# Patient Record
Sex: Female | Born: 1965 | Race: Black or African American | Hispanic: No | Marital: Single | Smoking: Never smoker
Health system: Southern US, Community
[De-identification: ages and names within clinical notes are randomized; demographics above are authoritative.]

## PROBLEM LIST (undated history)

## (undated) DIAGNOSIS — D649 Anemia, unspecified: Secondary | ICD-10-CM

## (undated) DIAGNOSIS — M1990 Unspecified osteoarthritis, unspecified site: Secondary | ICD-10-CM

## (undated) HISTORY — PX: VARICOSE VEIN SURGERY: SHX832

---

## 2004-01-18 ENCOUNTER — Ambulatory Visit: Payer: Self-pay | Admitting: Specialist

## 2005-07-25 ENCOUNTER — Ambulatory Visit: Payer: Self-pay

## 2010-09-22 DIAGNOSIS — L03116 Cellulitis of left lower limb: Secondary | ICD-10-CM | POA: Insufficient documentation

## 2012-01-08 ENCOUNTER — Ambulatory Visit: Payer: Self-pay | Admitting: Internal Medicine

## 2015-07-05 ENCOUNTER — Encounter: Payer: Self-pay | Admitting: Family Medicine

## 2015-07-05 ENCOUNTER — Ambulatory Visit (INDEPENDENT_AMBULATORY_CARE_PROVIDER_SITE_OTHER): Payer: Self-pay | Admitting: Family Medicine

## 2015-07-05 VITALS — BP 100/70 | HR 64 | Ht 61.0 in | Wt 243.0 lb

## 2015-07-05 DIAGNOSIS — L02415 Cutaneous abscess of right lower limb: Secondary | ICD-10-CM

## 2015-07-05 DIAGNOSIS — L739 Follicular disorder, unspecified: Secondary | ICD-10-CM

## 2015-07-05 DIAGNOSIS — Z23 Encounter for immunization: Secondary | ICD-10-CM

## 2015-07-05 MED ORDER — MUPIROCIN 2 % EX OINT: 1.0000 "application " | TOPICAL_OINTMENT | Freq: Two times a day (BID) | CUTANEOUS | Status: DC

## 2015-07-05 MED ORDER — DOXYCYCLINE HYCLATE 100 MG PO TABS: 100.0000 mg | ORAL_TABLET | Freq: Two times a day (BID) | ORAL | Status: DC

## 2015-07-05 MED ORDER — CLOBETASOL PROPIONATE 0.05 % EX OINT: 1.0000 "application " | TOPICAL_OINTMENT | Freq: Two times a day (BID) | CUTANEOUS | Status: DC

## 2015-07-05 NOTE — Progress Notes (Signed)
Name: Grace Harris   MRN: [id number]    DOB: [birth year]   Date:07/05/2015       Progress Note  Subjective  Chief Complaint  Chief Complaint  Patient presents with  . Recurrent Skin Infections    on leg, stomach, and genitals    Rash This is a new problem. The current episode started in the past 7 days. The problem has been gradually worsening since onset. The affected locations include the right lowerleg, genitalia and abdomen. The rash is characterized by itchiness, redness and swelling. She was exposed to nothing. Pertinent negatives include no anorexia, congestion, cough, diarrhea, eye pain, facial edema, fatigue, fever, joint pain, nail changes, rhinorrhea, shortness of breath, sore throat or vomiting. The treatment provided mild relief.    No problem-specific assessment & plan notes found for this encounter.   History reviewed. No pertinent past medical history.  Past Surgical History  Procedure Laterality Date  . Cesarean section      x 2  . Varicose vein surgery      Family History  Problem Relation Age of Onset  . Diabetes Maternal Uncle   . Cancer Maternal Grandmother     Social History   Social History  . Marital Status: Single    Spouse Name: N/A  . Number of Children: N/A  . Years of Education: N/A   Occupational History  . Not on file.   Social History Main Topics  . Smoking status: Never Smoker   . Smokeless tobacco: Not on file  . Alcohol Use: 0.0 oz/week    0 Standard drinks or equivalent per week  . Drug Use: No  . Sexual Activity: Yes   Other Topics Concern  . Not on file   Social History Narrative  . No narrative on file    No Known Allergies   Review of Systems  Constitutional: Negative for fever, chills, weight loss, malaise/fatigue and fatigue.  HENT: Negative for congestion, ear discharge, ear pain, rhinorrhea and sore throat.   Eyes: Negative for blurred vision and pain.  Respiratory: Negative for cough, sputum  production, shortness of breath and wheezing.   Cardiovascular: Negative for chest pain, palpitations and leg swelling.  Gastrointestinal: Negative for heartburn, nausea, vomiting, abdominal pain, diarrhea, constipation, blood in stool, melena and anorexia.  Genitourinary: Negative for dysuria, urgency, frequency and hematuria.  Musculoskeletal: Negative for myalgias, back pain, joint pain and neck pain.  Skin: Positive for rash. Negative for nail changes.  Neurological: Negative for dizziness, tingling, sensory change, focal weakness and headaches.  Endo/Heme/Allergies: Negative for environmental allergies and polydipsia. Does not bruise/bleed easily.  Psychiatric/Behavioral: Negative for depression and suicidal ideas. The patient is not nervous/anxious and does not have insomnia.      Objective  Filed Vitals:   07/05/15 0814  BP: 100/70  Pulse: 64  Height:  (1.549 m)  Weight: 243 lb (110.224 kg)    Physical Exam  Constitutional: She is well-developed, well-nourished, and in no distress. No distress.  HENT:  Head: Normocephalic and atraumatic.  Right Ear: External ear normal.  Left Ear: External ear normal.  Nose: Nose normal.  Mouth/Throat: Oropharynx is clear and moist.  Eyes: Conjunctivae and EOM are normal. Pupils are equal, round, and reactive to light. Right eye exhibits no discharge. Left eye exhibits no discharge.  Neck: Normal range of motion. Neck supple. No JVD present. No thyromegaly present.  Cardiovascular: Normal rate, regular rhythm, normal heart sounds and intact distal pulses.  Exam reveals no  gallop and no friction rub.   No murmur heard. Pulmonary/Chest: Effort normal and breath sounds normal.  Abdominal: Soft. Bowel sounds are normal. She exhibits no mass. There is no tenderness. There is no guarding.  Musculoskeletal: Normal range of motion. She exhibits no edema.  Lymphadenopathy:    She has no cervical adenopathy.  Neurological: She is alert. She  has normal reflexes.  Skin: Skin is warm, dry and intact. Rash noted. Rash is pustular. She is not diaphoretic. There is erythema.     Tenderness/swelling  Psychiatric: Mood and affect normal.  Nursing note and vitals reviewed.     Assessment & Plan  Problem List Items Addressed This Visit    None    Visit Diagnoses    Abscess of right leg    -  Primary    Relevant Medications    doxycycline (VIBRA-TABS) 100 MG tablet    Folliculitis        Relevant Medications    doxycycline (VIBRA-TABS) 100 MG tablet    clobetasol ointment (TEMOVATE) 0.05 %    mupirocin ointment (BACTROBAN) 2 %    Need for Tdap vaccination        Relevant Orders    Tdap vaccine greater than or equal to 7yo IM (Completed)         Dr. Hayden Rasmusseneanna Loanne Emery [facility]  07/05/2015

## 2015-07-05 NOTE — Patient Instructions (Signed)
Abscess °An abscess is an infected area that contains a collection of pus and debris. It can occur in almost any part of the body. An abscess is also known as a furuncle or boil. °CAUSES  °An abscess occurs when tissue gets infected. This can occur from blockage of oil or sweat glands, infection of hair follicles, or a minor injury to the skin. As the body tries to fight the infection, pus collects in the area and creates pressure under the skin. This pressure causes pain. People with weakened immune systems have difficulty fighting infections and get certain abscesses more often.  °SYMPTOMS °Usually an abscess develops on the skin and becomes a painful mass that is red, warm, and tender. If the abscess forms under the skin, you may feel a moveable soft area under the skin. Some abscesses break open (rupture) on their own, but most will continue to get worse without care. The infection can spread deeper into the body and eventually into the bloodstream, causing you to feel ill.  °DIAGNOSIS  °Your caregiver will take your medical history and perform a physical exam. A sample of fluid may also be taken from the abscess to determine what is causing your infection. °TREATMENT  °Your caregiver may prescribe antibiotic medicines to fight the infection. However, taking antibiotics alone usually does not cure an abscess. Your caregiver may need to make a small cut (incision) in the abscess to drain the pus. In some cases, gauze is packed into the abscess to reduce pain and to continue draining the area. °HOME CARE INSTRUCTIONS  °· Only take over-the-counter or prescription medicines for pain, discomfort, or fever as directed by your caregiver. °· If you were prescribed antibiotics, take them as directed. Finish them even if you start to feel better. °· If gauze is used, follow your caregiver's directions for changing the gauze. °· To avoid spreading the infection: °· Keep your draining abscess covered with a  bandage. °· Wash your hands well. °· Do not share personal care items, towels, or whirlpools with others. °· Avoid skin contact with others. °· Keep your skin and clothes clean around the abscess. °· Keep all follow-up appointments as directed by your caregiver. °SEEK MEDICAL CARE IF:  °· You have increased pain, swelling, redness, fluid drainage, or bleeding. °· You have muscle aches, chills, or a general ill feeling. °· You have a fever. °MAKE SURE YOU:  °· Understand these instructions. °· Will watch your condition. °· Will get help right away if you are not doing well or get worse. °  °This information is not intended to replace advice given to you by your health care provider. Make sure you discuss any questions you have with your health care provider. °  °Document Released: 11/28/2004 Document Revised: 08/20/2011 Document Reviewed: 05/03/2011 °Elsevier Interactive Patient Education ©2016 [employer] ° °Folliculitis °Folliculitis is redness, soreness, and swelling (inflammation) of the hair follicles. This condition can occur anywhere on the body. People with weakened immune systems, diabetes, or obesity have a greater risk of getting folliculitis. °CAUSES °· Bacterial infection. This is the most common cause. °· Fungal infection. °· Viral infection. °· Contact with certain chemicals, especially oils and tars. °Long-term folliculitis can result from bacteria that live in the nostrils. The bacteria may trigger multiple outbreaks of folliculitis over time. °SYMPTOMS °Folliculitis most commonly occurs on the scalp, thighs, legs, back, buttocks, and areas where hair is shaved frequently. An early sign of folliculitis is a small, white or yellow, pus-filled, itchy lesion (  pustule). These lesions appear on a red, inflamed follicle. They are usually less than 0.2 inches (5 mm) wide. When there is an infection of the follicle that goes deeper, it becomes a boil or furuncle. A group of closely packed boils creates a  larger lesion (carbuncle). Carbuncles tend to occur in hairy, sweaty areas of the body. °DIAGNOSIS  °Your caregiver can usually tell what is wrong by doing a physical exam. A sample may be taken from one of the lesions and tested in a lab. This can help determine what is causing your folliculitis. °TREATMENT  °Treatment may include: °· Applying warm compresses to the affected areas. °· Taking antibiotic medicines orally or applying them to the skin. °· Draining the lesions if they contain a large amount of pus or fluid. °· Laser hair removal for cases of long-lasting folliculitis. This helps to prevent regrowth of the hair. °HOME CARE INSTRUCTIONS °· Apply warm compresses to the affected areas as directed by your caregiver. °· If antibiotics are prescribed, take them as directed. Finish them even if you start to feel better. °· You may take over-the-counter medicines to relieve itching. °· Do not shave irritated skin. °· Follow up with your caregiver as directed. °SEEK IMMEDIATE MEDICAL CARE IF:  °· You have increasing redness, swelling, or pain in the affected area. °· You have a fever. °MAKE SURE YOU: °· Understand these instructions. °· Will watch your condition. °· Will get help right away if you are not doing well or get worse. °  °This information is not intended to replace advice given to you by your health care provider. Make sure you discuss any questions you have with your health care provider. °  °Document Released: 04/29/2001 Document Revised: 03/11/2014 Document Reviewed: 05/21/2011 °Elsevier Interactive Patient Education ©2016 Elsevier Inc. ° °

## 2016-01-08 ENCOUNTER — Ambulatory Visit (INDEPENDENT_AMBULATORY_CARE_PROVIDER_SITE_OTHER): Payer: Self-pay | Admitting: Family Medicine

## 2016-01-08 ENCOUNTER — Encounter: Payer: Self-pay | Admitting: Family Medicine

## 2016-01-08 VITALS — BP 100/60 | HR 68 | Ht 61.0 in | Wt 251.0 lb

## 2016-01-08 DIAGNOSIS — B3731 Acute candidiasis of vulva and vagina: Secondary | ICD-10-CM

## 2016-01-08 DIAGNOSIS — N760 Acute vaginitis: Secondary | ICD-10-CM

## 2016-01-08 DIAGNOSIS — B373 Candidiasis of vulva and vagina: Secondary | ICD-10-CM

## 2016-01-08 MED ORDER — METRONIDAZOLE 500 MG PO TABS: 500.0000 mg | ORAL_TABLET | Freq: Two times a day (BID) | ORAL | 0 refills | Status: DC

## 2016-01-08 MED ORDER — FLUCONAZOLE 150 MG PO TABS: 150.0000 mg | ORAL_TABLET | Freq: Once | ORAL | 0 refills | Status: AC

## 2016-01-08 NOTE — Patient Instructions (Signed)

## 2016-01-08 NOTE — Progress Notes (Signed)
Name: Grace EllisSharon D Harris   MRN: [id number]    DOB: 26-Apr-1965   Date:01/08/2016       Progress Note  Subjective  Chief Complaint  Chief Complaint  Patient presents with  . Vaginal Discharge    foul odor- thick, yellow discharge. Hx of bacterial vaginosis    Vaginal Discharge  The patient's primary symptoms include genital itching, a genital odor and vaginal discharge. The patient's pertinent negatives include no genital lesions, genital rash, missed menses, pelvic pain or vaginal bleeding. This is a new problem. The current episode started in the past 7 days. The problem occurs intermittently. The problem has been gradually worsening. The pain is mild. Pertinent negatives include no abdominal pain, back pain, chills, constipation, diarrhea, discolored urine, dysuria, fever, frequency, headaches, hematuria, joint pain, nausea, painful intercourse, rash, sore throat or urgency. The vaginal discharge was thick. There has been no bleeding. She has not been passing clots. Nothing aggravates the symptoms. She has tried nothing for the symptoms. The treatment provided no relief. She uses nothing for contraception. Her past medical history is significant for vaginosis. There is no history of PID or an STD.    No problem-specific Assessment & Plan notes found for this encounter.   History reviewed. No pertinent past medical history.  Past Surgical History:  Procedure Laterality Date  . CESAREAN SECTION     x 2  . VARICOSE VEIN SURGERY      Family History  Problem Relation Age of Onset  . Diabetes Maternal Uncle   . Cancer Maternal Grandmother     Social History   Social History  . Marital status: Single    Spouse name: N/A  . Number of children: N/A  . Years of education: N/A   Occupational History  . Not on file.   Social History Main Topics  . Smoking status: Never Smoker  . Smokeless tobacco: Never Used  . Alcohol use 0.0 oz/week  . Drug use: No  . Sexual activity: Yes    Other Topics Concern  . Not on file   Social History Narrative  . No narrative on file    No Known Allergies   Review of Systems  Constitutional: Negative for chills, fever, malaise/fatigue and weight loss.  HENT: Negative for ear discharge, ear pain and sore throat.   Eyes: Negative for blurred vision.  Respiratory: Negative for cough, sputum production, shortness of breath and wheezing.   Cardiovascular: Negative for chest pain, palpitations and leg swelling.  Gastrointestinal: Negative for abdominal pain, blood in stool, constipation, diarrhea, heartburn, melena and nausea.  Genitourinary: Positive for vaginal discharge. Negative for dysuria, frequency, hematuria, missed menses, pelvic pain and urgency.  Musculoskeletal: Negative for back pain, joint pain, myalgias and neck pain.  Skin: Negative for rash.  Neurological: Negative for dizziness, tingling, sensory change, focal weakness and headaches.  Endo/Heme/Allergies: Negative for environmental allergies and polydipsia. Does not bruise/bleed easily.  Psychiatric/Behavioral: Negative for depression and suicidal ideas. The patient is not nervous/anxious and does not have insomnia.      Objective  Vitals:   01/08/16 1446  BP: 100/60  Pulse: 68  Weight: 251 lb (113.9 kg)  Height: 5\' 1"  (1.549 m)    Physical Exam  Constitutional: She is well-developed, well-nourished, and in no distress. No distress.  HENT:  Head: Normocephalic and atraumatic.  Right Ear: External ear normal.  Left Ear: External ear normal.  Nose: Nose normal.  Mouth/Throat: Oropharynx is clear and moist.  Eyes: Conjunctivae and EOM  are normal. Pupils are equal, round, and reactive to light. Right eye exhibits no discharge. Left eye exhibits no discharge.  Neck: Normal range of motion. Neck supple. No JVD present. No thyromegaly present.  Cardiovascular: Normal rate, regular rhythm, normal heart sounds and intact distal pulses.  Exam reveals no  gallop and no friction rub.   No murmur heard. Pulmonary/Chest: Effort normal and breath sounds normal.  Abdominal: Soft. Bowel sounds are normal. She exhibits no mass. There is no tenderness. There is no guarding.  Musculoskeletal: Normal range of motion. She exhibits no edema.  Lymphadenopathy:    She has no cervical adenopathy.  Neurological: She is alert. She has normal reflexes.  Skin: Skin is warm and dry. She is not diaphoretic.  Psychiatric: Mood and affect normal.      Assessment & Plan  Problem List Items Addressed This Visit    None    Visit Diagnoses    Vaginosis    -  Primary   Relevant Medications   metroNIDAZOLE (FLAGYL) 500 MG tablet   fluconazole (DIFLUCAN) 150 MG tablet   Candidiasis of vulva       Relevant Medications   metroNIDAZOLE (FLAGYL) 500 MG tablet   fluconazole (DIFLUCAN) 150 MG tablet        Dr. Hayden Rasmusseneanna Calen [facility]  01/08/16

## 2016-03-13 ENCOUNTER — Encounter: Payer: Self-pay | Admitting: *Deleted

## 2016-03-13 ENCOUNTER — Telehealth: Payer: Self-pay

## 2016-03-13 ENCOUNTER — Ambulatory Visit
Admission: EM | Admit: 2016-03-13 | Discharge: 2016-03-13 | Disposition: A | Discharge status: Home / Self Care | Payer: Self-pay | Attending: Family Medicine | Admitting: Family Medicine

## 2016-03-13 DIAGNOSIS — J329 Chronic sinusitis, unspecified: Secondary | ICD-10-CM

## 2016-03-13 DIAGNOSIS — J339 Nasal polyp, unspecified: Secondary | ICD-10-CM

## 2016-03-13 MED ORDER — AMOXICILLIN-POT CLAVULANATE 875-125 MG PO TABS: 1.0000 | ORAL_TABLET | Freq: Two times a day (BID) | ORAL | 0 refills | Status: DC

## 2016-03-13 MED ORDER — AMOXICILLIN 500 MG PO CAPS: 1000.0000 mg | ORAL_CAPSULE | Freq: Two times a day (BID) | ORAL | 0 refills | Status: DC

## 2016-03-13 NOTE — ED Provider Notes (Signed)
CSN: [id number]     Arrival date & time 03/13/16  1317 History   First MD Initiated Contact with Patient 03/13/16 1431     Chief Complaint  Patient presents with  . Nasal Congestion  . Cough  . Fever   (Consider location/radiation/quality/duration/timing/severity/associated sxs/prior Treatment) The history is provided by the patient.  Cough  Cough characteristics:  Productive Sputum characteristics:  Manson PasseyBrown and green Severity:  Moderate Onset quality:  Gradual Duration:  6 days Timing:  Constant Associated symptoms: fever   Associated symptoms: no shortness of breath, no sore throat and no wheezing   Fever  Associated symptoms: congestion and cough   Associated symptoms: no sore throat   Sinus pain/pressure with purulent nasal drainage   History reviewed. No pertinent past medical history. Past Surgical History:  Procedure Laterality Date  . CESAREAN SECTION     x 2  . VARICOSE VEIN SURGERY     Family History  Problem Relation Age of Onset  . Diabetes Maternal Uncle   . Cancer Maternal Grandmother    Social History  Substance Use Topics  . Smoking status: Never Smoker  . Smokeless tobacco: Never Used  . Alcohol use No   OB History    No data available     Review of Systems  Constitutional: Positive for fever.  HENT: Positive for congestion, postnasal drip, sinus pain and sinus pressure. Negative for sneezing and sore throat.   Respiratory: Positive for cough. Negative for shortness of breath and wheezing.        Lungs CTA    Allergies  Patient has no known allergies.  Home Medications   Prior to Admission medications   Medication Sig Start Date End Date Taking? Authorizing Provider  amoxicillin-clavulanate (AUGMENTIN) 875-125 MG tablet Take 1 tablet by mouth 2 (two) times daily. 03/13/16   Constancia Geeting, NP  metroNIDAZOLE (FLAGYL) 500 MG tablet Take 1 tablet (500 mg total) by mouth 2 (two) times daily. 01/08/16   Duanne Limerickeanna C Jones, MD   Meds Ordered and  Administered this Visit  Medications - No data to display  BP 130/84 (BP Location: Left Wrist)   Pulse 74   Temp 98.5 F (36.9 C) (Oral)   Resp 16   Ht 5\' 1"  (1.549 m)   Wt 248 lb (112.5 kg)   LMP 02/25/2016   SpO2 100%   BMI 46.86 kg/m  No data found.   Physical Exam  Constitutional: She appears well-developed and well-nourished.  HENT:  Nose: Mucosal edema present. No sinus tenderness. Right sinus exhibits maxillary sinus tenderness and frontal sinus tenderness. Left sinus exhibits maxillary sinus tenderness and frontal sinus tenderness.    Pulmonary/Chest: Effort normal and breath sounds normal. No accessory muscle usage. No respiratory distress. She has no decreased breath sounds. She has no wheezes. She has no rhonchi. She has no rales.    Urgent Care Course   Clinical Course     Procedures (including critical care time)  Labs Review Labs Reviewed - No data to display  Imaging Review No results found.   Visual Acuity Review  Right Eye Distance:   Left Eye Distance:   Bilateral Distance:    Right Eye Near:   Left Eye Near:    Bilateral Near:         MDM   Flonase and saline nasal rinse OTC as directed     Hilton Saephan, NP 03/13/16 1441

## 2016-03-13 NOTE — Telephone Encounter (Signed)
Patient called in today and states that she is unable to afford her Augmentin. [facility] Patient advised that [facility] Amoxicillin [facility]

## 2016-03-13 NOTE — Discharge Instructions (Signed)
Flonase and Saline Nasal rinse OTC as directed

## 2016-03-13 NOTE — ED Triage Notes (Signed)
Patient stated having symptoms of nasal congestion / drainage, fever, and cough 3 days ago. No other symptoms reported.

## 2016-04-18 ENCOUNTER — Ambulatory Visit (INDEPENDENT_AMBULATORY_CARE_PROVIDER_SITE_OTHER): Payer: Self-pay | Admitting: Family Medicine

## 2016-04-18 ENCOUNTER — Encounter: Payer: Self-pay | Admitting: Family Medicine

## 2016-04-18 VITALS — BP 99/72 | HR 92 | Temp 98.1°F | Ht 62.0 in | Wt 241.0 lb

## 2016-04-18 DIAGNOSIS — R6883 Chills (without fever): Secondary | ICD-10-CM

## 2016-04-18 DIAGNOSIS — B379 Candidiasis, unspecified: Secondary | ICD-10-CM

## 2016-04-18 DIAGNOSIS — J0101 Acute recurrent maxillary sinusitis: Secondary | ICD-10-CM

## 2016-04-18 LAB — POCT INFLUENZA A/B
INFLUENZA A, POC: NEGATIVE
Influenza B, POC: NEGATIVE

## 2016-04-18 MED ORDER — FLUCONAZOLE 150 MG PO TABS: 150.0000 mg | ORAL_TABLET | Freq: Once | ORAL | 0 refills | Status: AC

## 2016-04-18 MED ORDER — AMOXICILLIN 500 MG PO CAPS: 500.0000 mg | ORAL_CAPSULE | Freq: Three times a day (TID) | ORAL | 0 refills | Status: DC

## 2016-04-18 NOTE — Progress Notes (Signed)
Name: Grace EllisSharon D Mcglaughlin   MRN: [id number]    DOB: Feb 07, 1966   Date:04/18/2016       Progress Note  Subjective  Chief Complaint  Chief Complaint  Patient presents with  . Sinus Problem    Pt stated having chills, body ache, sinus for 2 days    URI   This is a new problem. The current episode started yesterday. The problem has been gradually worsening. There has been no fever. Associated symptoms include congestion, coughing, headaches, rhinorrhea, sinus pain and sneezing. Pertinent negatives include no abdominal pain, chest pain, diarrhea, dysuria, ear pain, joint pain, joint swelling, nausea, neck pain, plugged ear sensation, rash, sore throat, swollen glands, vomiting or wheezing. She has tried NSAIDs and decongestant for the symptoms. The treatment provided no relief.  Sinusitis  This is a new problem. The current episode started yesterday. The problem has been waxing and waning since onset. There has been no fever. Associated symptoms include chills, congestion, coughing, headaches, sinus pressure and sneezing. Pertinent negatives include no ear pain, neck pain, shortness of breath, sore throat or swollen glands. (Yellow/nasal/sinus drainage)    No problem-specific Assessment & Plan notes found for this encounter.   No past medical history on file.  Past Surgical History:  Procedure Laterality Date  . CESAREAN SECTION     x 2  . VARICOSE VEIN SURGERY      Family History  Problem Relation Age of Onset  . Diabetes Maternal Uncle   . Cancer Maternal Grandmother     Social History   Social History  . Marital status: Single    Spouse name: N/A  . Number of children: N/A  . Years of education: N/A   Occupational History  . Not on file.   Social History Main Topics  . Smoking status: Never Smoker  . Smokeless tobacco: Never Used  . Alcohol use No  . Drug use: No  . Sexual activity: Yes   Other Topics Concern  . Not on file   Social History Narrative  . No  narrative on file    No Known Allergies   Review of Systems  Constitutional: Positive for chills. Negative for fever, malaise/fatigue and weight loss.  HENT: Positive for congestion, rhinorrhea, sinus pain, sinus pressure and sneezing. Negative for ear discharge, ear pain and sore throat.   Eyes: Negative for blurred vision.  Respiratory: Positive for cough. Negative for hemoptysis, sputum production, shortness of breath and wheezing.   Cardiovascular: Negative for chest pain, palpitations and leg swelling.  Gastrointestinal: Negative for abdominal pain, blood in stool, constipation, diarrhea, heartburn, melena, nausea and vomiting.  Genitourinary: Negative for dysuria, frequency, hematuria and urgency.  Musculoskeletal: Negative for back pain, joint pain, myalgias and neck pain.  Skin: Negative for rash.  Neurological: Positive for headaches. Negative for dizziness, tingling, sensory change and focal weakness.  Endo/Heme/Allergies: Negative for environmental allergies and polydipsia. Does not bruise/bleed easily.  Psychiatric/Behavioral: Negative for depression and suicidal ideas. The patient is not nervous/anxious and does not have insomnia.      Objective  Vitals:   04/18/16 0857  BP: 99/72  Pulse: 92  Temp: 98.1 F (36.7 C)  SpO2: 96%  Weight: 241 lb (109.3 kg)  Height: 5\' 2"  (1.575 m)    Physical Exam  Constitutional: She is well-developed, well-nourished, and in no distress. No distress.  HENT:  Head: Normocephalic and atraumatic.  Right Ear: Tympanic membrane, external ear and ear canal normal.  Left Ear: Tympanic membrane, external ear  and ear canal normal.  Nose: Nose normal. Right sinus exhibits no maxillary sinus tenderness and no frontal sinus tenderness. Left sinus exhibits no maxillary sinus tenderness and no frontal sinus tenderness.  Mouth/Throat: Oropharynx is clear and moist.  Eyes: Conjunctivae and EOM are normal. Pupils are equal, round, and reactive to  light. Right eye exhibits no discharge. Left eye exhibits no discharge.  Neck: Normal range of motion. Neck supple. No JVD present. No thyromegaly present.  Cardiovascular: Normal rate, regular rhythm, normal heart sounds and intact distal pulses.  Exam reveals no gallop and no friction rub.   No murmur heard. Pulmonary/Chest: Effort normal and breath sounds normal. She has no wheezes. She has no rales.  Abdominal: Soft. Bowel sounds are normal. She exhibits no mass. There is no tenderness. There is no guarding.  Musculoskeletal: Normal range of motion. She exhibits no edema.  Lymphadenopathy:    She has no cervical adenopathy.  Neurological: She is alert. She has normal reflexes.  Skin: Skin is warm and dry. No rash noted. She is not diaphoretic.  Psychiatric: Mood and affect normal.  Nursing note and vitals reviewed.     Assessment & Plan  Problem List Items Addressed This Visit    None    Visit Diagnoses    Acute recurrent maxillary sinusitis    -  Primary   Relevant Medications   amoxicillin (AMOXIL) 500 MG capsule   fluconazole (DIFLUCAN) 150 MG tablet   Chills (without fever)       Relevant Orders   POCT Influenza A/B (Completed)   Candidiasis       Relevant Medications   fluconazole (DIFLUCAN) 150 MG tablet        Dr. Hayden [facility]  04/18/16

## 2016-06-11 ENCOUNTER — Ambulatory Visit (INDEPENDENT_AMBULATORY_CARE_PROVIDER_SITE_OTHER): Payer: Managed Care, Other (non HMO) | Admitting: Family Medicine

## 2016-06-11 ENCOUNTER — Encounter: Payer: Self-pay | Admitting: Family Medicine

## 2016-06-11 VITALS — BP 128/84 | HR 87 | Resp 16 | Ht 62.0 in | Wt 231.6 lb

## 2016-06-11 DIAGNOSIS — M778 Other enthesopathies, not elsewhere classified: Secondary | ICD-10-CM

## 2016-06-11 DIAGNOSIS — M25542 Pain in joints of left hand: Secondary | ICD-10-CM

## 2016-06-11 DIAGNOSIS — M779 Enthesopathy, unspecified: Principal | ICD-10-CM

## 2016-06-11 MED ORDER — PREDNISONE 10 MG PO TABS: 10.0000 mg | ORAL_TABLET | Freq: Every day | ORAL | 0 refills | Status: DC

## 2016-06-11 MED ORDER — MELOXICAM 7.5 MG PO TABS: 7.5000 mg | ORAL_TABLET | Freq: Every day | ORAL | 0 refills | Status: DC

## 2016-06-11 NOTE — Progress Notes (Signed)
Name: Grace Harris   MRN: [id number]    DOB: 1966/02/17   Date:06/11/2016       Progress Note  Subjective  Chief Complaint  Chief Complaint  Patient presents with  . Hand Pain    left hand Ist digit swelling and pain since Sat.     Hand Pain   The incident occurred 3 to 5 days ago. The incident occurred at home. There was no injury mechanism. The pain is present in the left hand and left fingers. The quality of the pain is described as aching. The pain does not radiate. The pain is at a severity of 4/10. The pain is moderate. The pain has been fluctuating since the incident. Pertinent negatives include no chest pain, muscle weakness, numbness or tingling. The symptoms are aggravated by movement. She has tried NSAIDs for the symptoms. The treatment provided moderate relief.    No problem-specific Assessment & Plan notes found for this encounter.   History reviewed. No pertinent past medical history.  Past Surgical History:  Procedure Laterality Date  . CESAREAN SECTION     x 2  . VARICOSE VEIN SURGERY      Family History  Problem Relation Age of Onset  . Diabetes Maternal Uncle   . Cancer Maternal Grandmother     Social History   Social History  . Marital status: Single    Spouse name: N/A  . Number of children: N/A  . Years of education: N/A   Occupational History  . Not on file.   Social History Main Topics  . Smoking status: Never Smoker  . Smokeless tobacco: Never Used  . Alcohol use No  . Drug use: No  . Sexual activity: Yes   Other Topics Concern  . Not on file   Social History Narrative  . No narrative on file    No Known Allergies  Outpatient Medications Prior to Visit  Medication Sig Dispense Refill  . amoxicillin (AMOXIL) 500 MG capsule Take 1 capsule (500 mg total) by mouth 3 (three) times daily. 30 capsule 0   No facility-administered medications prior to visit.     Review of Systems  Constitutional: Negative for chills, fever,  malaise/fatigue and weight loss.  HENT: Negative for ear discharge, ear pain and sore throat.   Eyes: Negative for blurred vision.  Respiratory: Negative for cough, sputum production, shortness of breath and wheezing.   Cardiovascular: Negative for chest pain, palpitations and leg swelling.  Gastrointestinal: Negative for abdominal pain, blood in stool, constipation, diarrhea, heartburn, melena and nausea.  Genitourinary: Negative for dysuria, frequency, hematuria and urgency.  Musculoskeletal: Negative for back pain, joint pain, myalgias and neck pain.  Skin: Negative for rash.  Neurological: Negative for dizziness, tingling, sensory change, focal weakness, numbness and headaches.  Endo/Heme/Allergies: Negative for environmental allergies and polydipsia. Does not bruise/bleed easily.  Psychiatric/Behavioral: Negative for depression and suicidal ideas. The patient is not nervous/anxious and does not have insomnia.      Objective  Vitals:   06/11/16 1618  BP: 128/84  Pulse: 87  Resp: 16  SpO2: 98%  Weight: 231 lb 9.6 oz (105.1 kg)  Height:  (1.575 m)    Physical Exam  Constitutional: She is well-developed, well-nourished, and in no distress. No distress.  HENT:  Head: Normocephalic and atraumatic.  Right Ear: External ear normal.  Left Ear: External ear normal.  Nose: Nose normal.  Mouth/Throat: Oropharynx is clear and moist.  Eyes: Conjunctivae and EOM are normal. Pupils  are equal, round, and reactive to light. Right eye exhibits no discharge. Left eye exhibits no discharge.  Neck: Normal range of motion. Neck supple. No JVD present. No thyromegaly present.  Cardiovascular: Normal rate, regular rhythm, normal heart sounds and intact distal pulses.  Exam reveals no gallop and no friction rub.   No murmur heard. Pulmonary/Chest: Effort normal and breath sounds normal.  Abdominal: Soft. Bowel sounds are normal. She exhibits no mass. There is no tenderness. There is no  guarding.  Musculoskeletal: Normal range of motion. She exhibits no edema.       Left hand: She exhibits tenderness and bony tenderness. She exhibits normal range of motion.       Hands: Lymphadenopathy:    She has no cervical adenopathy.  Neurological: She is alert.  Skin: Skin is warm and dry. She is not diaphoretic.  Psychiatric: Mood and affect normal.  Nursing note and vitals reviewed.     Assessment & Plan  Problem List Items Addressed This Visit    None    Visit Diagnoses    Left hand tendonitis    -  Primary   Relevant Medications   predniSONE (DELTASONE) 10 MG tablet   meloxicam (MOBIC) 7.5 MG tablet   Arthralgia of left hand       Relevant Medications   predniSONE (DELTASONE) 10 MG tablet   meloxicam (MOBIC) 7.5 MG tablet      Meds ordered this encounter  Medications  . predniSONE (DELTASONE) 10 MG tablet    Sig: Take 1 tablet (10 mg total) by mouth daily with breakfast.    Dispense:  30 tablet    Refill:  0    2 a day for 5  Days then 1 a day for 5 days  . meloxicam (MOBIC) 7.5 MG tablet    Sig: Take 1 tablet (7.5 mg total) by mouth daily.    Dispense:  30 tablet    Refill:  0      Dr. Hayden [facility]  06/11/16

## 2016-06-18 ENCOUNTER — Ambulatory Visit
Admission: EM | Admit: 2016-06-18 | Discharge: 2016-06-18 | Disposition: A | Discharge status: Home / Self Care | Payer: Managed Care, Other (non HMO) | Attending: Family Medicine | Admitting: Family Medicine

## 2016-06-18 DIAGNOSIS — R42 Dizziness and giddiness: Secondary | ICD-10-CM

## 2016-06-18 DIAGNOSIS — E860 Dehydration: Secondary | ICD-10-CM | POA: Diagnosis not present

## 2016-06-18 NOTE — ED Triage Notes (Signed)
Pt says she was at work and started seeing lines in her vision. She has started having a headache, and she just doesn't feel right.

## 2016-06-18 NOTE — ED Provider Notes (Signed)
CSN: [id number]     Arrival date & time 06/18/16  1450 History   First MD Initiated Contact with Patient 06/18/16 1658     Chief Complaint  Patient presents with  . Dizziness   (Consider location/radiation/quality/duration/timing/severity/associated sxs/prior Treatment) HPI  A 51 year old female who states that work today she was not feeling well and having a headache Also noticed some flashing lights in her eyes as well as  lines in her vision. No nausea vomiting denies any near-syncope or syncopal episodes. No palpitations . Her headache is mild located in her mid forehead. Does not have a throbbing quality. She does not have any photosensitivity. Is no history of migraines. Milinda Hirschfeld. States that she had has not been drinking much water today she is not have much thirst. She  tries to drink 2 L per day. She was nowhere near that today. She had a very salty hamburger last night.        History reviewed. No pertinent past medical history. Past Surgical History:  Procedure Laterality Date  . CESAREAN SECTION     x 2  . VARICOSE VEIN SURGERY     Family History  Problem Relation Age of Onset  . Diabetes Maternal Uncle   . Cancer Maternal Grandmother   . Alcohol abuse Mother    Social History  Substance Use Topics  . Smoking status: Never Smoker  . Smokeless tobacco: Never Used  . Alcohol use No   OB History    No data available     Review of Systems  Constitutional: Positive for activity change and fatigue. Negative for chills and fever.  Neurological: Positive for dizziness and headaches. Negative for weakness.  All other systems reviewed and are negative.   Allergies  Patient has no known allergies.  Home Medications   Prior to Admission medications   Not on File   Meds Ordered and Administered this Visit  Medications - No data to display  BP 140/70 (BP Location: Left Arm)   Pulse 67   Temp 98 F (36.7 C) (Oral)   Resp 18   Ht  (1.575 m)   Wt 230  lb (104.3 kg)   LMP 06/11/2016   SpO2 100%   BMI 42.07 kg/m  Orthostatic VS for the past 24 hrs:  BP- Lying Pulse- Lying BP- Sitting Pulse- Sitting BP- Standing at 0 minutes Pulse- Standing at 0 minutes  06/18/16 1521 120/65 64 119/74 67 109/75 73    Physical Exam  Constitutional: She is oriented to person, place, and time. She appears well-developed and well-nourished. No distress.  HENT:  Head: Normocephalic and atraumatic.  Right Ear: External ear normal.  Left Ear: External ear normal.  Nose: Nose normal.  Mouth/Throat: Oropharynx is clear and moist. No oropharyngeal exudate.  Eyes: Conjunctivae and EOM are normal. Pupils are equal, round, and reactive to light. Right eye exhibits no discharge. Left eye exhibits no discharge.  Neck: Normal range of motion. Neck supple.  Pulmonary/Chest: Effort normal and breath sounds normal. No respiratory distress. She has no wheezes. She has no rales.  Musculoskeletal: Normal range of motion.  Lymphadenopathy:    She has no cervical adenopathy.  Neurological: She is alert and oriented to person, place, and time. She displays normal reflexes. No cranial nerve deficit or sensory deficit. She exhibits normal muscle tone. Coordination normal.  Skin: Skin is warm and dry. She is not diaphoretic.  Psychiatric: She has a normal mood and affect. Her behavior is normal. Judgment and  thought content normal.  Nursing note and vitals reviewed.   Urgent Care Course     Procedures (including critical care time)  Labs Review Labs Reviewed - No data to display  Imaging Review No results found.   Visual Acuity Review  Right Eye Distance:   Left Eye Distance:   Bilateral Distance:    Right Eye Near:   Left Eye Near:    Bilateral Near:         MDM   1. Dehydration   2. Dizziness    There are no discharge medications for this patient. Told patient that she used to drink more fluids that portion of her problem is that of dehydration.  Not prescribe any specific medications for her. If she is not improving she should follow-up with her primary care physician Dr. Yetta Barre. He worsens tonight she should go to emergency department. Recommend she should follow-up with Dr. Yetta Barre because of this episode for evaluation and possible treatment.    Lutricia Feil, PA-C 06/18/16 2036    Lutricia Feil, PA-C 06/18/16 2039

## 2016-07-09 ENCOUNTER — Ambulatory Visit (INDEPENDENT_AMBULATORY_CARE_PROVIDER_SITE_OTHER): Payer: Managed Care, Other (non HMO) | Admitting: Family Medicine

## 2016-07-09 ENCOUNTER — Encounter: Payer: Self-pay | Admitting: Family Medicine

## 2016-07-09 VITALS — BP 124/62 | HR 80 | Ht 62.0 in | Wt 231.0 lb

## 2016-07-09 DIAGNOSIS — Z1239 Encounter for other screening for malignant neoplasm of breast: Secondary | ICD-10-CM

## 2016-07-09 DIAGNOSIS — M25562 Pain in left knee: Secondary | ICD-10-CM

## 2016-07-09 DIAGNOSIS — G8929 Other chronic pain: Secondary | ICD-10-CM | POA: Diagnosis not present

## 2016-07-09 DIAGNOSIS — Z0000 Encounter for general adult medical examination without abnormal findings: Secondary | ICD-10-CM

## 2016-07-09 DIAGNOSIS — E663 Overweight: Secondary | ICD-10-CM

## 2016-07-09 DIAGNOSIS — Z1231 Encounter for screening mammogram for malignant neoplasm of breast: Secondary | ICD-10-CM | POA: Diagnosis not present

## 2016-07-09 NOTE — Progress Notes (Signed)
Name: Grace Harris   MRN: [id number]    DOB: February 10, 1966   Date:07/09/2016       Progress Note  Subjective  Chief Complaint  Chief Complaint  Patient presents with  . Annual Exam    no pap, breast exam and sched mammo    Patient presents for annual physical exam.  Leg Pain   The incident occurred more than 1 week ago. There was no injury mechanism. The pain is present in the left knee. The quality of the pain is described as aching. The pain is at a severity of 6/10. The pain is moderate. The pain has been constant since onset. Associated symptoms include an inability to bear weight. Pertinent negatives include no loss of motion, loss of sensation, muscle weakness, numbness or tingling. The symptoms are aggravated by movement. She has tried acetaminophen and NSAIDs for the symptoms. The treatment provided no relief.    No problem-specific Assessment & Plan notes found for this encounter.   No past medical history on file.  Past Surgical History:  Procedure Laterality Date  . CESAREAN SECTION     x 2  . VARICOSE VEIN SURGERY      Family History  Problem Relation Age of Onset  . Diabetes Maternal Uncle   . Cancer Maternal Grandmother   . Alcohol abuse Mother     Social History   Social History  . Marital status: Single    Spouse name: N/A  . Number of children: N/A  . Years of education: N/A   Occupational History  . Not on file.   Social History Main Topics  . Smoking status: Never Smoker  . Smokeless tobacco: Never Used  . Alcohol use No  . Drug use: No  . Sexual activity: Yes   Other Topics Concern  . Not on file   Social History Narrative  . No narrative on file    No Known Allergies  No outpatient prescriptions prior to visit.   No facility-administered medications prior to visit.     Review of Systems  Constitutional: Negative for chills, fever, malaise/fatigue and weight loss.  HENT: Negative for ear discharge, ear pain and sore throat.    Eyes: Negative for blurred vision.  Respiratory: Negative for cough, sputum production, shortness of breath and wheezing.   Cardiovascular: Negative for chest pain, palpitations and leg swelling.  Gastrointestinal: Negative for abdominal pain, blood in stool, constipation, diarrhea, heartburn, melena and nausea.  Genitourinary: Negative for dysuria, frequency, hematuria and urgency.  Musculoskeletal: Negative for back pain, joint pain, myalgias and neck pain.  Skin: Negative for rash.  Neurological: Negative for dizziness, tingling, sensory change, focal weakness, numbness and headaches.  Endo/Heme/Allergies: Negative for environmental allergies and polydipsia. Does not bruise/bleed easily.  Psychiatric/Behavioral: Negative for depression and suicidal ideas. The patient is not nervous/anxious and does not have insomnia.      Objective  Vitals:   07/09/16 0848  BP: 124/62  Pulse: 80  Weight: 231 lb (104.8 kg)  Height: 5\' 2"  (1.575 m)    Physical Exam  Constitutional: She is well-developed, well-nourished, and in no distress. No distress.  HENT:  Head: Normocephalic and atraumatic.  Right Ear: Tympanic membrane, external ear and ear canal normal.  Left Ear: Tympanic membrane, external ear and ear canal normal.  Nose: Nose normal.  Mouth/Throat: Uvula is midline and oropharynx is clear and moist.  Eyes: Conjunctivae, EOM and lids are normal. Pupils are equal, round, and reactive to light. Right eye exhibits  no discharge. Left eye exhibits no discharge.  Fundoscopic exam:      The right eye shows no arteriolar narrowing and no papilledema.       The left eye shows no arteriolar narrowing and no papilledema.  Neck: Trachea normal, normal range of motion and full passive range of motion without pain. Neck supple. Normal carotid pulses, no hepatojugular reflux and no JVD present. Carotid bruit is not present. No thyroid mass and no thyromegaly present.  Cardiovascular: Normal rate,  regular rhythm, S1 normal, S2 normal, normal heart sounds and intact distal pulses.  PMI is not displaced.  Exam reveals no gallop, no S3, no S4 and no friction rub.   No murmur heard. Pulmonary/Chest: Effort normal and breath sounds normal. No accessory muscle usage. No respiratory distress. Right breast exhibits no inverted nipple, no mass, no nipple discharge, no skin change and no tenderness. Left breast exhibits no inverted nipple, no mass, no nipple discharge, no skin change and no tenderness. Breasts are symmetrical.  Abdominal: Soft. Normal aorta and bowel sounds are normal. She exhibits no mass. There is no hepatosplenomegaly. There is no tenderness. There is no guarding and no CVA tenderness.  Musculoskeletal: Normal range of motion. She exhibits no edema.       Left knee: She exhibits no swelling and no effusion. Tenderness found. Medial joint line and lateral joint line tenderness noted. No MCL, no LCL and no patellar tendon tenderness noted.       Cervical back: Normal.       Thoracic back: Normal.       Lumbar back: Normal.  Lymphadenopathy:       Head (right side): No submandibular adenopathy present.       Head (left side): No submandibular adenopathy present.    She has no cervical adenopathy.  Neurological: She is alert. She has normal sensation, normal strength, normal reflexes and intact cranial nerves.  Skin: Skin is warm and dry. She is not diaphoretic. No pallor.  Psychiatric: Mood and affect normal.  Nursing note and vitals reviewed.     Assessment & Plan  Problem List Items Addressed This Visit    None    Visit Diagnoses    Annual physical exam    -  Primary   Relevant Orders   Renal function panel   Lipid panel   CBC with Differential/Platelet   Chronic pain of left knee       Relevant Orders   Ambulatory referral to Orthopedic Surgery   Overweight       Breast cancer screening       Relevant Orders   MM Digital Screening      No orders of the  defined types were placed in this encounter.     Dr. Hayden Rasmusseneanna Georgann Bramble [facility]  07/09/16

## 2016-07-10 LAB — RENAL FUNCTION PANEL
ALBUMIN: 4 g/dL (ref 3.5–5.5)
BUN / CREAT RATIO: 16 (ref 9–23)
BUN: 12 mg/dL (ref 6–24)
CALCIUM: 8.4 mg/dL — AB (ref 8.7–10.2)
CO2: 25 mmol/L (ref 18–29)
CREATININE: 0.74 mg/dL (ref 0.57–1.00)
Chloride: 104 mmol/L (ref 96–106)
GFR calc non Af Amer: 96 mL/min/{1.73_m2} (ref >59)
GFR, EST AFRICAN AMERICAN: 111 mL/min/{1.73_m2} (ref >59)
Glucose: 87 mg/dL (ref 65–99)
PHOSPHORUS: 3.2 mg/dL (ref 2.5–4.5)
POTASSIUM: 4.4 mmol/L (ref 3.5–5.2)
SODIUM: 144 mmol/L (ref 134–144)

## 2016-07-10 LAB — CBC WITH DIFFERENTIAL/PLATELET
BASOS ABS: 0.1 10*3/uL (ref 0.0–0.2)
Basos: 1 %
EOS (ABSOLUTE): 0.4 10*3/uL (ref 0.0–0.4)
Eos: 8 %
HEMOGLOBIN: 10.3 g/dL — AB (ref 11.1–15.9)
Hematocrit: 34.1 % (ref 34.0–46.6)
IMMATURE GRANS (ABS): 0 10*3/uL (ref 0.0–0.1)
Immature Granulocytes: 0 %
LYMPHS ABS: 0.8 10*3/uL (ref 0.7–3.1)
Lymphs: 15 %
MCH: 24.8 pg — ABNORMAL LOW (ref 26.6–33.0)
MCHC: 30.2 g/dL — AB (ref 31.5–35.7)
MCV: 82 fL (ref 79–97)
MONOCYTES: 8 %
Monocytes Absolute: 0.4 10*3/uL (ref 0.1–0.9)
Neutrophils Absolute: 3.5 10*3/uL (ref 1.4–7.0)
Neutrophils: 68 %
Platelets: 260 10*3/uL (ref 150–379)
RBC: 4.15 x10E6/uL (ref 3.77–5.28)
RDW: 17.2 % — ABNORMAL HIGH (ref 12.3–15.4)
WBC: 5.2 10*3/uL (ref 3.4–10.8)

## 2016-07-10 LAB — LIPID PANEL
CHOL/HDL RATIO: 3.2 ratio (ref 0.0–4.4)
Cholesterol, Total: 139 mg/dL (ref 100–199)
HDL: 43 mg/dL (ref >39)
LDL Calculated: 84 mg/dL (ref 0–99)
Triglycerides: 62 mg/dL (ref 0–149)
VLDL Cholesterol Cal: 12 mg/dL (ref 5–40)

## 2016-07-11 ENCOUNTER — Other Ambulatory Visit: Payer: Self-pay | Admitting: *Deleted

## 2016-07-11 ENCOUNTER — Inpatient Hospital Stay
Admission: RE | Admit: 2016-07-11 | Discharge: 2016-07-11 | Disposition: A | Discharge status: Home / Self Care | Payer: Self-pay | Source: Ambulatory Visit | Attending: *Deleted | Admitting: *Deleted

## 2016-07-11 DIAGNOSIS — Z9289 Personal history of other medical treatment: Secondary | ICD-10-CM

## 2016-07-15 DIAGNOSIS — M1711 Unilateral primary osteoarthritis, right knee: Secondary | ICD-10-CM | POA: Insufficient documentation

## 2016-07-15 DIAGNOSIS — M1712 Unilateral primary osteoarthritis, left knee: Secondary | ICD-10-CM | POA: Insufficient documentation

## 2016-07-16 ENCOUNTER — Ambulatory Visit
Admission: RE | Admit: 2016-07-16 | Discharge: 2016-07-16 | Disposition: A | Discharge status: Home / Self Care | Payer: Managed Care, Other (non HMO) | Source: Ambulatory Visit | Attending: Family Medicine | Admitting: Family Medicine

## 2016-07-16 DIAGNOSIS — Z1231 Encounter for screening mammogram for malignant neoplasm of breast: Secondary | ICD-10-CM | POA: Insufficient documentation

## 2016-07-16 DIAGNOSIS — Z1239 Encounter for other screening for malignant neoplasm of breast: Secondary | ICD-10-CM

## 2016-08-05 ENCOUNTER — Other Ambulatory Visit: Payer: Self-pay

## 2016-08-20 ENCOUNTER — Other Ambulatory Visit: Payer: Self-pay | Admitting: Orthopedic Surgery

## 2016-08-20 DIAGNOSIS — M2392 Unspecified internal derangement of left knee: Secondary | ICD-10-CM

## 2016-08-20 DIAGNOSIS — M1712 Unilateral primary osteoarthritis, left knee: Secondary | ICD-10-CM

## 2016-11-21 ENCOUNTER — Other Ambulatory Visit: Payer: Self-pay | Admitting: Orthopedic Surgery

## 2016-11-21 DIAGNOSIS — M2392 Unspecified internal derangement of left knee: Secondary | ICD-10-CM

## 2016-11-21 DIAGNOSIS — M1712 Unilateral primary osteoarthritis, left knee: Secondary | ICD-10-CM

## 2016-11-27 ENCOUNTER — Ambulatory Visit: Payer: Managed Care, Other (non HMO)

## 2017-01-24 ENCOUNTER — Ambulatory Visit
Admission: RE | Admit: 2017-01-24 | Discharge: 2017-01-24 | Disposition: A | Discharge status: Home / Self Care | Payer: Managed Care, Other (non HMO) | Source: Ambulatory Visit | Attending: Orthopedic Surgery | Admitting: Orthopedic Surgery

## 2017-01-24 DIAGNOSIS — M7122 Synovial cyst of popliteal space [Baker], left knee: Secondary | ICD-10-CM | POA: Diagnosis not present

## 2017-01-24 DIAGNOSIS — M1712 Unilateral primary osteoarthritis, left knee: Secondary | ICD-10-CM | POA: Diagnosis not present

## 2017-01-24 DIAGNOSIS — M2392 Unspecified internal derangement of left knee: Secondary | ICD-10-CM | POA: Diagnosis present

## 2017-03-18 ENCOUNTER — Encounter: Payer: Self-pay | Admitting: *Deleted

## 2017-03-18 ENCOUNTER — Ambulatory Visit
Admission: EM | Admit: 2017-03-18 | Discharge: 2017-03-18 | Disposition: A | Discharge status: Home / Self Care | Payer: Managed Care, Other (non HMO) | Attending: Family Medicine | Admitting: Family Medicine

## 2017-03-18 ENCOUNTER — Other Ambulatory Visit: Payer: Self-pay

## 2017-03-18 DIAGNOSIS — J069 Acute upper respiratory infection, unspecified: Secondary | ICD-10-CM

## 2017-03-18 DIAGNOSIS — R05 Cough: Secondary | ICD-10-CM

## 2017-03-18 DIAGNOSIS — B9789 Other viral agents as the cause of diseases classified elsewhere: Secondary | ICD-10-CM

## 2017-03-18 HISTORY — DX: Unspecified osteoarthritis, unspecified site: M19.90

## 2017-03-18 MED ORDER — BENZONATATE 100 MG PO CAPS: 100.0000 mg | ORAL_CAPSULE | Freq: Three times a day (TID) | ORAL | 0 refills | Status: DC | PRN

## 2017-03-18 NOTE — ED Triage Notes (Signed)
Patient started having symptoms of cough, nasal congestion, and body aches 3 days ago. Patient recently returned from a cruise.

## 2017-03-18 NOTE — ED Provider Notes (Signed)
[facility]    CSN: [id number] Arrival date & time: 03/18/17  0827  History   Chief Complaint Chief Complaint  Patient presents with  . Cough  . Nasal Congestion   HPI  52 year old female presents with cough and congestion.  Patient reports that her symptoms started on Saturday.  This occurred after she returned home from a cruise.  She reports that she has had a dry cough and congestion.  No fever.  She reports associated headache and back pain as well.  No reported sick contacts.  He took some TheraFlu without resolution.  No known exacerbating factors.  No other complaints or concerns at this time.  Past Medical History:  Diagnosis Date  . Arthritis    Past Surgical History:  Procedure Laterality Date  . CESAREAN SECTION     x 2  . VARICOSE VEIN SURGERY      OB History    No data available     Home Medications    Prior to Admission medications   Medication Sig Start Date End Date Taking? Authorizing Provider  meloxicam (MOBIC) 15 MG tablet Take 15 mg by mouth daily.   Yes [provider]  traMADol (ULTRAM) 50 MG tablet Take 50 mg by mouth every 6 (six) hours as needed.   Yes [provider]  benzonatate (TESSALON) 100 MG capsule Take 1 capsule (100 mg total) by mouth 3 (three) times daily as needed. 03/18/17   Tommie Samsook, Zollie Clemence G, DO   Family History Family History  Problem Relation Age of Onset  . Diabetes Maternal Uncle   . Cancer Maternal Grandmother   . Alcohol abuse Mother   . Breast cancer Neg Hx    Social History Social History   Tobacco Use  . Smoking status: Never Smoker  . Smokeless tobacco: Never Used  Substance Use Topics  . Alcohol use: No    Alcohol/week: 0.0 oz  . Drug use: No   Allergies   Patient has no known allergies.  Review of Systems Review of Systems  Constitutional: Negative for fever.  HENT: Positive for congestion.   Respiratory: Positive for cough.   Musculoskeletal: Positive for back pain.    Neurological: Positive for headaches.   Physical Exam Triage Vital Signs ED Triage Vitals  Enc Vitals Group     BP --      Pulse Rate 03/18/17 0919 61     Resp 03/18/17 0919 16     Temp 03/18/17 0919 98.4 F (36.9 C)     Temp Source 03/18/17 0919 Oral     SpO2 03/18/17 0919 100 %     Weight 03/18/17 0920 235 lb (106.6 kg)     Height 03/18/17 0920 5\' 2"  (1.575 m)     Head Circumference --      Peak Flow --      Pain Score 03/18/17 0920 0     Pain Loc --      Pain Edu? --      Excl. in GC? --    Updated Vital Signs Pulse 61   Temp 98.4 F (36.9 C) (Oral)   Resp 16   Ht 5\' 2"  (1.575 m)   Wt 235 lb (106.6 kg)   LMP 03/18/2017   SpO2 100%   BMI 42.98 kg/m   Physical Exam  Constitutional: She is oriented to person, place, and time. She appears well-developed and well-nourished. No distress.  HENT:  Head: Normocephalic and atraumatic.  Mouth/Throat: Oropharynx is clear and  moist.  Normal TMs bilaterally.  Neck: Neck supple.  Cardiovascular: Normal rate and regular rhythm.  No murmur heard. Pulmonary/Chest: Effort normal and breath sounds normal. She has no wheezes. She has no rales.  Lymphadenopathy:    She has no cervical adenopathy.  Neurological: She is alert and oriented to person, place, and time.  Psychiatric: She has a normal mood and affect. Her behavior is normal.  Nursing note and vitals reviewed.  UC Treatments / Results  Labs (all labs ordered are listed, but only abnormal results are displayed) Labs Reviewed - No data to display  EKG  EKG Interpretation None       Radiology No results found.  Procedures Procedures (including critical care time)  Medications Ordered in UC Medications - No data to display   Initial Impression / Assessment and Plan / UC Course  I have reviewed the triage vital signs and the nursing notes.  Pertinent labs & imaging results that were available during my care of the patient were reviewed by me and  considered in my medical decision making (see chart for details).    52 year old female presents with a viral illness.  Supportive care and over-the-counter medication as needed.  Tessalon Perles for cough.  Final Clinical Impressions(s) / UC Diagnoses   Final diagnoses:  Viral URI with cough    ED Discharge Orders        Ordered    benzonatate (TESSALON) 100 MG capsule  3 times daily PRN     03/18/17 0938     Controlled Substance Prescriptions [place] Controlled Substance Registry consulted? Not Applicable   Tommie Sams, [place] 03/18/17 8295

## 2017-03-18 NOTE — Discharge Instructions (Signed)
Tylenol/motrin as needed for pain.  Cough medication as needed.  Rest, fluids.  Take care  Dr. Adriana Simasook

## 2017-03-21 ENCOUNTER — Telehealth: Payer: Self-pay | Admitting: *Deleted

## 2017-03-21 NOTE — Telephone Encounter (Signed)
Called patient, no answer, left message to follow up with PCP if symptoms persist or become worse. 

## 2017-04-04 HISTORY — PX: JOINT REPLACEMENT: SHX530

## 2017-04-09 ENCOUNTER — Encounter: Payer: Self-pay | Admitting: Family Medicine

## 2017-04-09 ENCOUNTER — Ambulatory Visit: Payer: BLUE CROSS/BLUE SHIELD | Admitting: Family Medicine

## 2017-04-09 VITALS — BP 120/80 | HR 80 | Ht 62.0 in | Wt 234.0 lb

## 2017-04-09 DIAGNOSIS — B9689 Other specified bacterial agents as the cause of diseases classified elsewhere: Secondary | ICD-10-CM | POA: Diagnosis not present

## 2017-04-09 DIAGNOSIS — Z23 Encounter for immunization: Secondary | ICD-10-CM | POA: Diagnosis not present

## 2017-04-09 DIAGNOSIS — N760 Acute vaginitis: Secondary | ICD-10-CM

## 2017-04-09 LAB — POCT WET PREP WITH KOH
Clue Cells Wet Prep HPF POC: POSITIVE
KOH PREP POC: NEGATIVE
RBC Wet Prep HPF POC: NEGATIVE
TRICHOMONAS UA: NEGATIVE
Yeast Wet Prep HPF POC: NEGATIVE

## 2017-04-09 MED ORDER — METRONIDAZOLE 500 MG PO TABS: 500.0000 mg | ORAL_TABLET | Freq: Two times a day (BID) | ORAL | 0 refills | Status: DC

## 2017-04-09 NOTE — Progress Notes (Signed)
Name: Grace EllisSharon D Gheen   MRN: [id number]    DOB: [birth year]   Date:04/09/2017       Progress Note  Subjective  Chief Complaint  Chief Complaint  Patient presents with  . vaginal odor    "fishy smell before period started"- no smell now, but is currently on 4th day of period    Vaginal Discharge  The patient's primary symptoms include a genital odor and vaginal discharge. The patient's pertinent negatives include no genital itching, genital lesions, genital rash, missed menses, pelvic pain or vaginal bleeding. This is a chronic problem. The current episode started more than 1 year ago. The problem occurs daily. The problem has been gradually worsening. The pain is mild. Associated symptoms include frequency. Pertinent negatives include no abdominal pain, back pain, chills, constipation, diarrhea, discolored urine, dysuria, fever, headaches, hematuria, joint pain, nausea, rash, sore throat or urgency. The vaginal discharge was white, yellow and thin. There has been no bleeding. Nothing aggravates the symptoms.    No problem-specific Assessment & Plan notes found for this encounter.   Past Medical History:  Diagnosis Date  . Arthritis     Past Surgical History:  Procedure Laterality Date  . CESAREAN SECTION     x 2  . VARICOSE VEIN SURGERY      Family History  Problem Relation Age of Onset  . Diabetes Maternal Uncle   . Cancer Maternal Grandmother   . Alcohol abuse Mother   . Breast cancer Neg Hx     Social History   Socioeconomic History  . Marital status: Single    Spouse name: Not on file  . Number of children: Not on file  . Years of education: Not on file  . Highest education level: Not on file  Social Needs  . Financial resource strain: Not on file  . Food insecurity - worry: Not on file  . Food insecurity - inability: Not on file  . Transportation needs - medical: Not on file  . Transportation needs - non-medical: Not on file  Occupational History  . Not on  file  Tobacco Use  . Smoking status: Never Smoker  . Smokeless tobacco: Never Used  Substance and Sexual Activity  . Alcohol use: No    Alcohol/week: 0.0 oz  . Drug use: No  . Sexual activity: Yes  Other Topics Concern  . Not on file  Social History Narrative  . Not on file    No Known Allergies  Outpatient Medications Prior to Visit  Medication Sig Dispense Refill  . meloxicam (MOBIC) 15 MG tablet Take 15 mg by mouth daily.    . traMADol (ULTRAM) 50 MG tablet Take 50 mg by mouth every 6 (six) hours as needed.    . benzonatate (TESSALON) 100 MG capsule Take 1 capsule (100 mg total) by mouth 3 (three) times daily as needed. 30 capsule 0   No facility-administered medications prior to visit.     Review of Systems  Constitutional: Negative for chills, fever, malaise/fatigue and weight loss.  HENT: Negative for ear discharge, ear pain and sore throat.   Eyes: Negative for blurred vision.  Respiratory: Negative for cough, sputum production, shortness of breath and wheezing.   Cardiovascular: Negative for chest pain, palpitations and leg swelling.  Gastrointestinal: Negative for abdominal pain, blood in stool, constipation, diarrhea, heartburn, melena and nausea.  Genitourinary: Positive for frequency and vaginal discharge. Negative for dysuria, hematuria, missed menses, pelvic pain and urgency.  Musculoskeletal: Negative for back pain, joint  pain, myalgias and neck pain.  Skin: Negative for rash.  Neurological: Negative for dizziness, tingling, sensory change, focal weakness and headaches.  Endo/Heme/Allergies: Negative for environmental allergies and polydipsia. Does not bruise/bleed easily.  Psychiatric/Behavioral: Negative for depression and suicidal ideas. The patient is not nervous/anxious and does not have insomnia.      Objective  Vitals:   04/09/17 1043  BP: 120/80  Pulse: 80  Weight: 234 lb (106.1 kg)  Height: 5\' 2"  (1.575 m)    Physical Exam  Constitutional:  She is well-developed, well-nourished, and in no distress. No distress.  HENT:  Head: Normocephalic and atraumatic.  Right Ear: External ear normal.  Left Ear: External ear normal.  Nose: Nose normal.  Mouth/Throat: Oropharynx is clear and moist.  Eyes: Conjunctivae and EOM are normal. Pupils are equal, round, and reactive to light. Right eye exhibits no discharge. Left eye exhibits no discharge.  Neck: Normal range of motion. Neck supple. No JVD present. No thyromegaly present.  Cardiovascular: Normal rate, regular rhythm, normal heart sounds and intact distal pulses. Exam reveals no gallop and no friction rub.  No murmur heard. Pulmonary/Chest: Effort normal and breath sounds normal. She has no wheezes. She has no rales.  Abdominal: Soft. Bowel sounds are normal. She exhibits no mass. There is no tenderness. There is no guarding.  Musculoskeletal: Normal range of motion. She exhibits no edema.  Lymphadenopathy:    She has no cervical adenopathy.  Neurological: She is alert. She has normal reflexes.  Skin: Skin is warm and dry. She is not diaphoretic.  Psychiatric: Mood and affect normal.  Nursing note and vitals reviewed.     Assessment & Plan  Problem List Items Addressed This Visit    None    Visit Diagnoses    Bacterial vaginosis    -  Primary   Relevant Medications   metroNIDAZOLE (FLAGYL) 500 MG tablet   Other Relevant Orders   Flu Vaccine QUAD 36+ mos IM (Completed)   POCT Wet Prep with KOH (Completed)      Meds ordered this encounter  Medications  . metroNIDAZOLE (FLAGYL) 500 MG tablet    Sig: Take 1 tablet (500 mg total) by mouth 2 (two) times daily.    Dispense:  14 tablet    Refill:  0  Health risks of being over weight were discussed and patient was counseled on weight loss options and exercise.    Dr. [facility]  04/09/17

## 2017-05-27 ENCOUNTER — Other Ambulatory Visit: Payer: Self-pay

## 2017-05-27 ENCOUNTER — Encounter
Admission: RE | Admit: 2017-05-27 | Discharge: 2017-05-27 | Disposition: A | Discharge status: Home / Self Care | Payer: BLUE CROSS/BLUE SHIELD | Source: Ambulatory Visit | Attending: Unknown Physician Specialty | Admitting: Unknown Physician Specialty

## 2017-05-27 HISTORY — DX: Anemia, unspecified: D64.9

## 2017-05-27 NOTE — Patient Instructions (Signed)
Your procedure is scheduled on: 05-28-17  Report to Same Day Surgery 2nd floor medical mall [facility](Medical Mall Entrance-take elevator on left to 2nd floor.  Check in with surgery information desk.) To find out your arrival time please call [phone] between 1PM - 3PM on 05-27-17  Remember: Instructions that are not followed completely may result in serious medical risk, up to and including death, or upon the discretion of your surgeon and anesthesiologist your surgery may need to be rescheduled.    _x___ 1. Do not eat food after midnight the night before your procedure. NO GUM OR CANDY AFTER MIDNIGHT.  You may drink clear liquids up to 2 hours before you are scheduled to arrive at the hospital for your procedure.  Do not drink clear liquids within 2 hours of your scheduled arrival to the hospital.  Clear liquids include  --Water or Apple juice without pulp  --Clear carbohydrate beverage such as ClearFast or Gatorade  --Black Coffee or Clear Tea (No milk, no creamers, do not add anything to the coffee or Tea    __x__ 2. No Alcohol for 24 hours before or after surgery.   __x__3. No Smoking or e-cigarettes for 24 prior to surgery.  Do not use any chewable tobacco products for at least 6 hour prior to surgery   ____  4. Bring all medications with you on the day of surgery if instructed.    __x__ 5. Notify your doctor if there is any change in your medical condition     (cold, fever, infections).    x___6. On the morning of surgery brush your teeth with toothpaste and water.  You may rinse your mouth with mouth wash if you wish.  Do not swallow any toothpaste or mouthwash.   Do not wear jewelry, make-up, hairpins, clips or nail polish.  Do not wear lotions, powders, or perfumes. You may wear deodorant.  Do not shave 48 hours prior to surgery. Men may shave face and neck.  Do not bring valuables to the hospital.    [facility] belongings or valuables.    Contacts, dentures or bridgework may not be worn into surgery.  Leave your suitcase in the car. After surgery it may be brought to your room.  For patients admitted to the hospital, discharge time is determined by your treatment team.  _  Patients discharged the day of surgery will not be allowed to drive home.  You will need someone to drive you home and stay with you the night of your procedure.     ____ Take anti-hypertensive listed below, cardiac, seizure, asthma, anti-reflux and psychiatric medicines. These include:  1. NONE  2.  3.  4.  5.  6.  ____Fleets enema or Magnesium Citrate as directed.   ____ Use CHG Soap or sage wipes as directed on instruction sheet   ____ Use inhalers on the day of surgery and bring to hospital day of surgery  ____ Stop Metformin and Janumet 2 days prior to surgery.    ____ Take 1/2 of usual insulin dose the night before surgery and none on the morning surgery.   ____ Follow recommendations from Cardiologist, Pulmonologist or PCP regarding stopping Aspirin, Coumadin, Plavix ,Eliquis, Effient, or Pradaxa, and Pletal.  X____Stop Anti-inflammatories such as Advil, Aleve, Ibuprofen, Motrin, Naproxen,MELOXICAM, Naprosyn, Goodies powders or aspirin products NOW-OK to take Tylenol OR TRAMADOL   ____ Stop supplements until after surgery   ____ Bring C-Pap to the hospital.

## 2017-05-28 ENCOUNTER — Other Ambulatory Visit: Payer: Self-pay

## 2017-05-28 ENCOUNTER — Ambulatory Visit: Payer: BLUE CROSS/BLUE SHIELD | Admitting: Anesthesiology

## 2017-05-28 ENCOUNTER — Ambulatory Visit
Admission: RE | Admit: 2017-05-28 | Discharge: 2017-05-28 | Disposition: A | Discharge status: Home / Self Care | Payer: BLUE CROSS/BLUE SHIELD | Source: Ambulatory Visit | Attending: Unknown Physician Specialty | Admitting: Unknown Physician Specialty

## 2017-05-28 ENCOUNTER — Encounter
Admission: RE | Disposition: A | Discharge status: Home / Self Care | Payer: Self-pay | Source: Ambulatory Visit | Attending: Unknown Physician Specialty

## 2017-05-28 DIAGNOSIS — Z6841 Body Mass Index (BMI) 40.0 and over, adult: Secondary | ICD-10-CM | POA: Diagnosis not present

## 2017-05-28 DIAGNOSIS — Z96652 Presence of left artificial knee joint: Secondary | ICD-10-CM | POA: Insufficient documentation

## 2017-05-28 DIAGNOSIS — Z791 Long term (current) use of non-steroidal anti-inflammatories (NSAID): Secondary | ICD-10-CM | POA: Diagnosis not present

## 2017-05-28 DIAGNOSIS — Z79891 Long term (current) use of opiate analgesic: Secondary | ICD-10-CM | POA: Insufficient documentation

## 2017-05-28 DIAGNOSIS — M24662 Ankylosis, left knee: Secondary | ICD-10-CM | POA: Insufficient documentation

## 2017-05-28 HISTORY — PX: KNEE CLOSED REDUCTION: SHX995

## 2017-05-28 LAB — POCT PREGNANCY, URINE: Preg Test, Ur: NEGATIVE

## 2017-05-28 SURGERY — MANIPULATION, KNEE, CLOSED
Anesthesia: General | Site: Knee | Laterality: Left

## 2017-05-28 MED ORDER — FENTANYL CITRATE (PF) 100 MCG/2ML IJ SOLN
INTRAMUSCULAR | Status: AC
  Administered 2017-05-28: 25 ug via INTRAVENOUS
  Filled 2017-05-28: qty 2

## 2017-05-28 MED ORDER — FENTANYL CITRATE (PF) 100 MCG/2ML IJ SOLN
INTRAMUSCULAR | Status: DC | PRN
  Administered 2017-05-28 (×2): 50 ug via INTRAVENOUS

## 2017-05-28 MED ORDER — ONDANSETRON HCL 4 MG/2ML IJ SOLN
INTRAMUSCULAR | Status: AC
  Filled 2017-05-28: qty 2

## 2017-05-28 MED ORDER — SUCCINYLCHOLINE CHLORIDE 20 MG/ML IJ SOLN
INTRAMUSCULAR | Status: DC | PRN
  Administered 2017-05-28: 20 mg via INTRAVENOUS

## 2017-05-28 MED ORDER — PROPOFOL 10 MG/ML IV BOLUS
INTRAVENOUS | Status: AC
  Filled 2017-05-28: qty 20

## 2017-05-28 MED ORDER — LACTATED RINGERS IV SOLN
INTRAVENOUS | Status: DC
  Administered 2017-05-28: 08:00:00 via INTRAVENOUS

## 2017-05-28 MED ORDER — FAMOTIDINE 20 MG PO TABS
20.0000 mg | ORAL_TABLET | Freq: Once | ORAL | Status: AC
  Administered 2017-05-28: 20 mg via ORAL

## 2017-05-28 MED ORDER — PROPOFOL 10 MG/ML IV BOLUS
INTRAVENOUS | Status: DC | PRN
  Administered 2017-05-28: 80 mg via INTRAVENOUS

## 2017-05-28 MED ORDER — LIDOCAINE HCL (PF) 2 % IJ SOLN
INTRAMUSCULAR | Status: AC
  Filled 2017-05-28: qty 10

## 2017-05-28 MED ORDER — FENTANYL CITRATE (PF) 100 MCG/2ML IJ SOLN
25.0000 ug | INTRAMUSCULAR | Status: AC | PRN
  Administered 2017-05-28 (×6): 25 ug via INTRAVENOUS

## 2017-05-28 MED ORDER — MIDAZOLAM HCL 2 MG/2ML IJ SOLN
INTRAMUSCULAR | Status: AC
  Filled 2017-05-28: qty 2

## 2017-05-28 MED ORDER — MIDAZOLAM HCL 2 MG/2ML IJ SOLN
INTRAMUSCULAR | Status: DC | PRN
  Administered 2017-05-28: 2 mg via INTRAVENOUS

## 2017-05-28 MED ORDER — FENTANYL CITRATE (PF) 100 MCG/2ML IJ SOLN
INTRAMUSCULAR | Status: AC
  Filled 2017-05-28: qty 2

## 2017-05-28 MED ORDER — FAMOTIDINE 20 MG PO TABS
ORAL_TABLET | ORAL | Status: AC
  Administered 2017-05-28: 20 mg via ORAL
  Filled 2017-05-28: qty 1

## 2017-05-28 MED ORDER — ONDANSETRON HCL 4 MG/2ML IJ SOLN
4.0000 mg | Freq: Once | INTRAMUSCULAR | Status: AC | PRN
  Administered 2017-05-28: 4 mg via INTRAVENOUS

## 2017-05-28 NOTE — Anesthesia Preprocedure Evaluation (Signed)
Anesthesia Evaluation  Patient identified by MRN, date of birth, ID band Patient awake    Reviewed: Allergy & Precautions, NPO status , Patient's Chart, lab work & pertinent test results, reviewed documented beta blocker date and time   Airway Mallampati: III  TM Distance: >3 FB     Dental  (+) Chipped   Pulmonary           Cardiovascular      Neuro/Psych    GI/Hepatic   Endo/Other    Renal/GU      Musculoskeletal  (+) Arthritis ,   Abdominal   Peds  Hematology  (+) anemia ,   Anesthesia Other Findings Obese.  Reproductive/Obstetrics                             Anesthesia Physical Anesthesia Plan  ASA: III  Anesthesia Plan: General   Post-op Pain Management:    Induction: Intravenous  PONV Risk Score and Plan:   Airway Management Planned:   Additional Equipment:   Intra-op Plan:   Post-operative Plan:   Informed Consent: I have reviewed the patients History and Physical, chart, labs and discussed the procedure including the risks, benefits and alternatives for the proposed anesthesia with the patient or authorized representative who has indicated his/her understanding and acceptance.     Plan Discussed with: CRNA  Anesthesia Plan Comments:         Anesthesia Quick Evaluation

## 2017-05-28 NOTE — OR Nursing (Signed)
Pt. C/o nausea , zofran adm.

## 2017-05-28 NOTE — Discharge Instructions (Signed)
AMBULATORY SURGERY  DISCHARGE INSTRUCTIONS   1) The drugs that you were given will stay in your system until tomorrow so for the next 24 hours you should not:  A) Drive an automobile B) Make any legal decisions C) Drink any alcoholic beverage   2) You may resume regular meals tomorrow.  Today it is better to start with liquids and gradually work up to solid foods.  You may eat anything you prefer, but it is better to start with liquids, then soup and crackers, and gradually work up to solid foods.   3) Please notify your doctor immediately if you have any unusual bleeding, trouble breathing, redness and pain at the surgery site, drainage, fever, or pain not relieved by medication. 4)   5) Your post-operative visit with Dr.                                     is: Date:                        Time:    Please call to schedule your post-operative visit.  6) Additional Instructions:      Ice pack  RTC in about 10 days  Elevation  Resume PT tomorrow  WBAT

## 2017-05-28 NOTE — H&P (Signed)
  H and P reviewed. No changes. Uploaded at later date. 

## 2017-05-28 NOTE — Transfer of Care (Signed)
Immediate Anesthesia Transfer of Care Note  Patient: Grace EllisSharon D Harris  Procedure(s) Performed: CLOSED MANIPULATION KNEE (Left Knee)  Patient Location: PACU  Anesthesia Type:General  Level of Consciousness: awake, alert  and oriented  Airway & Oxygen Therapy: Patient Spontanous Breathing and Patient connected to nasal cannula oxygen  Post-op Assessment: Report given to RN and Post -op Vital signs reviewed and stable  Post vital signs: Reviewed and stable  Last Vitals:  Vitals Value Taken Time  BP 131/76 05/28/2017  8:23 AM  Temp 36.1 C 05/28/2017  8:23 AM  Pulse 84 05/28/2017  8:25 AM  Resp 17 05/28/2017  8:25 AM  SpO2 100 % 05/28/2017  8:25 AM  Vitals shown include unvalidated device data.  Last Pain:  Vitals:   05/28/17 0745  TempSrc: Temporal  PainSc:          Complications: No apparent anesthesia complications

## 2017-05-28 NOTE — Anesthesia Postprocedure Evaluation (Signed)
Anesthesia Post Note  Patient: Grace EllisSharon D Colgate  Procedure(s) Performed: CLOSED MANIPULATION KNEE (Left Knee)  Patient location during evaluation: PACU Anesthesia Type: General Level of consciousness: awake and alert Pain management: pain level controlled Vital Signs Assessment: post-procedure vital signs reviewed and stable Respiratory status: spontaneous breathing, nonlabored ventilation, respiratory function stable and patient connected to nasal cannula oxygen Cardiovascular status: blood pressure returned to baseline and stable Postop Assessment: no apparent nausea or vomiting Anesthetic complications: no     Last Vitals:  Vitals:   05/28/17 0905 05/28/17 0941  BP: 133/79 129/83  Pulse: 65 67  Resp: 16 16  Temp: (!) 36.1 C   SpO2: 100% 100%    Last Pain:  Vitals:   05/28/17 0941  TempSrc:   PainSc: 2                  Andrianna Manalang S

## 2017-05-28 NOTE — Anesthesia Post-op Follow-up Note (Signed)
Anesthesia QCDR form completed.        

## 2017-05-28 NOTE — Op Note (Signed)
05/28/2017  8:43 AM  PATIENT:  Grace Harris  52 y.o. female  PRE-OPERATIVE DIAGNOSIS:  STATUS POST left TOTAL KNEE replacement  POST-OPERATIVE DIAGNOSIS: Same  PROCEDURE:  Procedure(s): CLOSED MANIPULATION KNEE (Left) knee under anesthesia  SURGEON:   Isidoro DonningHarold Brisa Auth, Jr., MD  ANESTHESIA: General  HISTORY: The patient had a robotic assisted left total knee replacement about 6 weeks ago.  She had only been able to achieve about 80 to 85 degrees of left knee flexion.  Consequently it was felt that she needed her left knee manipulated under anesthesia.  OP NOTE: The patient was taken to the operating room where satisfactory general anesthesia was achieved.  The left knee was then manually manipulated.  I was able to increase her knee flexion from 80-85 degrees to about 115 degrees.  Subsequent to the manipulation an ice pack was applied to the patient's left knee.  The patient was then awakened and transferred to her stretcher bed.  She was taken to the recovery room in satisfactory condition.

## 2017-07-07 ENCOUNTER — Encounter: Payer: Self-pay | Admitting: Family Medicine

## 2017-07-07 ENCOUNTER — Ambulatory Visit: Payer: BLUE CROSS/BLUE SHIELD | Admitting: Family Medicine

## 2017-07-07 VITALS — BP 120/62 | HR 98 | Ht 62.0 in | Wt 243.0 lb

## 2017-07-07 DIAGNOSIS — J0100 Acute maxillary sinusitis, unspecified: Secondary | ICD-10-CM

## 2017-07-07 MED ORDER — AZITHROMYCIN 250 MG PO TABS: ORAL_TABLET | ORAL | 0 refills | Status: DC

## 2017-07-07 NOTE — Progress Notes (Signed)
Name: Grace Harris   MRN: [id number]    DOB: 01-Oct-1965   Date:07/07/2017       Progress Note  Subjective  Chief Complaint  Chief Complaint  Patient presents with  . Sinusitis    head pressure, green nasal production, little cough- no production    Sinusitis  This is a new problem. The current episode started in the past 7 days (Saturday). The problem has been gradually worsening since onset. There has been no fever. Her pain is at a severity of 5/10. The pain is mild. Associated symptoms include congestion, coughing, headaches, sinus pressure and sneezing. Pertinent negatives include no chills, diaphoresis, ear pain, hoarse voice, neck pain, shortness of breath, sore throat or swollen glands. Past treatments include oral decongestants. The treatment provided mild relief.    No problem-specific Assessment & Plan notes found for this encounter.   Past Medical History:  Diagnosis Date  . Anemia   . Arthritis     Past Surgical History:  Procedure Laterality Date  . CESAREAN SECTION     x 2  . JOINT REPLACEMENT Left 04/2017   @ [facility] [place]  . KNEE CLOSED REDUCTION Left 05/28/2017   Procedure: CLOSED MANIPULATION KNEE;  Surgeon: Erin Sons, MD;  Location: [facility] ORS;  Service: Orthopedics;  Laterality: Left;  Marland Kitchen VARICOSE VEIN SURGERY      Family History  Problem Relation Age of Onset  . Diabetes Maternal Uncle   . Cancer Maternal Grandmother   . Alcohol abuse Mother   . Breast cancer Neg Hx     Social History   Socioeconomic History  . Marital status: Single    Spouse name: Not on file  . Number of children: Not on file  . Years of education: Not on file  . Highest education level: Not on file  Occupational History  . Not on file  Social Needs  . Financial resource strain: Not on file  . Food insecurity:    Worry: Not on file    Inability: Not on file  . Transportation needs:    Medical: Not on file    Non-medical: Not on file   Tobacco Use  . Smoking status: Never Smoker  . Smokeless tobacco: Never Used  Substance and Sexual Activity  . Alcohol use: Yes    Alcohol/week: 0.0 oz    Comment: RARE  . Drug use: No  . Sexual activity: Yes  Lifestyle  . Physical activity:    Days per week: Not on file    Minutes per session: Not on file  . Stress: Not on file  Relationships  . Social connections:    Talks on phone: Not on file    Gets together: Not on file    Attends religious service: Not on file    Active member of club or organization: Not on file    Attends meetings of clubs or organizations: Not on file    Relationship status: Not on file  . Intimate partner violence:    Fear of current or ex partner: Not on file    Emotionally abused: Not on file    Physically abused: Not on file    Forced sexual activity: Not on file  Other Topics Concern  . Not on file  Social History Narrative  . Not on file    No Known Allergies  Outpatient Medications Prior to Visit  Medication Sig Dispense Refill  . meloxicam (MOBIC) 15 MG tablet Take 15 mg by  mouth daily as needed for pain.     . traMADol (ULTRAM) 50 MG tablet Take 50 mg by mouth daily as needed for moderate pain.     . metroNIDAZOLE (FLAGYL) 500 MG tablet Take 1 tablet (500 mg total) by mouth 2 (two) times daily. (Patient not taking: Reported on 05/26/2017) 14 tablet 0   No facility-administered medications prior to visit.     Review of Systems  Constitutional: Negative for chills, diaphoresis, fever, malaise/fatigue and weight loss.  HENT: Positive for congestion, sinus pressure and sneezing. Negative for ear discharge, ear pain, hoarse voice and sore throat.   Eyes: Positive for blurred vision.  Respiratory: Positive for cough. Negative for sputum production, shortness of breath and wheezing.   Cardiovascular: Negative for chest pain, palpitations and leg swelling.  Gastrointestinal: Negative for abdominal pain, blood in stool, constipation,  diarrhea, heartburn, melena and nausea.  Genitourinary: Negative for dysuria, frequency, hematuria and urgency.  Musculoskeletal: Negative for back pain, joint pain, myalgias and neck pain.  Skin: Negative for rash.  Neurological: Positive for headaches. Negative for dizziness, tingling, sensory change and focal weakness.  Endo/Heme/Allergies: Negative for environmental allergies and polydipsia. Does not bruise/bleed easily.  Psychiatric/Behavioral: Negative for depression and suicidal ideas. The patient is not nervous/anxious and does not have insomnia.      Objective  Vitals:   07/07/17 1421  BP: 120/62  Pulse: 98  Weight: 243 lb (110.2 kg)  Height:  (1.575 m)    Physical Exam  Constitutional: She is oriented to person, place, and time. She appears well-developed and well-nourished.  HENT:  Head: Normocephalic.  Right Ear: Tympanic membrane, external ear and ear canal normal.  Left Ear: Tympanic membrane, external ear and ear canal normal.  Nose: Mucosal edema present. Right sinus exhibits maxillary sinus tenderness. Right sinus exhibits no frontal sinus tenderness. Left sinus exhibits maxillary sinus tenderness. Left sinus exhibits no frontal sinus tenderness.  Mouth/Throat: Uvula is midline and oropharynx is clear and moist. No oropharyngeal exudate, posterior oropharyngeal edema or posterior oropharyngeal erythema.  Eyes: Pupils are equal, round, and reactive to light. Conjunctivae and EOM are normal. Lids are everted and swept, no foreign bodies found. Left eye exhibits no hordeolum. No foreign body present in the left eye. Right conjunctiva is not injected. Left conjunctiva is not injected. No scleral icterus.  Neck: Normal range of motion. Neck supple. No JVD present. No tracheal deviation present. No thyromegaly present.  Cardiovascular: Normal rate, regular rhythm, normal heart sounds and intact distal pulses. Exam reveals no gallop and no friction rub.  No murmur  heard. Pulmonary/Chest: Effort normal and breath sounds normal. No respiratory distress. She has no decreased breath sounds. She has no wheezes. She has no rhonchi. She has no rales.  Abdominal: Soft. Bowel sounds are normal. She exhibits no mass. There is no hepatosplenomegaly. There is no tenderness. There is no rebound and no guarding.  Musculoskeletal: Normal range of motion. She exhibits no edema or tenderness.  Lymphadenopathy:       Head (right side): No submandibular adenopathy present.       Head (left side): No submandibular adenopathy present.    She has no cervical adenopathy.  Neurological: She is alert and oriented to person, place, and time. She has normal strength. She displays normal reflexes. No cranial nerve deficit.  Skin: Skin is warm. No rash noted.  Psychiatric: She has a normal mood and affect. Her mood appears not anxious. She does not exhibit a depressed mood.  Nursing note and vitals reviewed.     Assessment & Plan  Problem List Items Addressed This Visit    None    Visit Diagnoses    Acute maxillary sinusitis, recurrence not specified    -  Primary   Relevant Medications   azithromycin (ZITHROMAX) 250 MG tablet      Meds ordered this encounter  Medications  . azithromycin (ZITHROMAX) 250 MG tablet    Sig: 2 tablets today then 1 a day for 4 days    Dispense:  6 tablet    Refill:  0      Dr. [facility]  07/07/17

## 2017-08-29 DIAGNOSIS — I8002 Phlebitis and thrombophlebitis of superficial vessels of left lower extremity: Secondary | ICD-10-CM | POA: Insufficient documentation

## 2017-10-07 ENCOUNTER — Ambulatory Visit (INDEPENDENT_AMBULATORY_CARE_PROVIDER_SITE_OTHER): Payer: BLUE CROSS/BLUE SHIELD | Admitting: Vascular Surgery

## 2017-10-07 ENCOUNTER — Encounter (INDEPENDENT_AMBULATORY_CARE_PROVIDER_SITE_OTHER): Payer: Self-pay | Admitting: Vascular Surgery

## 2017-10-07 VITALS — BP 127/73 | HR 77 | Resp 13 | Ht 62.0 in | Wt 228.0 lb

## 2017-10-07 DIAGNOSIS — Z96652 Presence of left artificial knee joint: Secondary | ICD-10-CM | POA: Diagnosis not present

## 2017-10-07 DIAGNOSIS — R600 Localized edema: Secondary | ICD-10-CM | POA: Diagnosis not present

## 2017-10-07 NOTE — Progress Notes (Signed)
Subjective:    Patient ID: Grace EllisSharon D Bertagnolli, female    DOB: 1966-02-05, 52 y.o.   MRN: [id number] Chief Complaint  Patient presents with  . New Patient (Initial Visit)    Phelbitis   Presents as a new patient referred by Dr. [facility] for left lower extremity edema and discomfort.  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  The patient notes that her swelling has progressively worsened over the last few months.  Her swelling is associated with some discomfort.  The patient notes that her swelling worsens with sitting and standing for long periods of time.  The patient feels that her swelling associated discomfort has progressed to the point that she is unable to function on a daily basis and it has become lifestyle limiting.  The patient has been wearing medical grade 1 compression socks for approximately a month and a half with minimal improvement to her edema.  The patient denies any claudication-like symptoms, rest pain or ulcer formation to the bilateral lower extremity.  The patient has been variant seeing what she describes as inflammation to the distal calf.  The patient denies any fever, nausea vomiting.  Review of Systems  Constitutional: Negative.   HENT: Negative.   Eyes: Negative.   Respiratory: Negative.   Cardiovascular: Positive for leg swelling.       Left lower extremity discomfort  Gastrointestinal: Negative.   Endocrine: Negative.   Genitourinary: Negative.   Musculoskeletal: Negative.   Skin: Negative.   Allergic/Immunologic: Negative.   Neurological: Negative.   Hematological: Negative.   Psychiatric/Behavioral: Negative.       Objective:   Physical Exam  Constitutional: She is oriented to person, place, and time. She appears well-developed and well-nourished. No distress.  HENT:  Head: Normocephalic and atraumatic.  Right Ear: External ear normal.  Left Ear: External ear normal.  Eyes: Pupils are equal, round, and reactive  to light. Conjunctivae and EOM are normal.  Neck: Normal range of motion.  Cardiovascular: Normal rate, regular rhythm, normal heart sounds and intact distal pulses.  Pulses:      Radial pulses are 2+ on the right side, and 2+ on the left side.  Unable to palpate pedal pulses due to body habitus and edema however the bilateral feet are warm  Pulmonary/Chest: Effort normal and breath sounds normal.  Musculoskeletal: Normal range of motion. She exhibits edema (Moderate nonpitting edema noted to the left lower extremity mild edema noted to the right lower extremity).  Neurological: She is alert and oriented to person, place, and time.  Skin: Skin is warm and dry. She is not diaphoretic.  There is no stasis dermatitis, fibrosis, cellulitis or active ulcerations at this time.  Psychiatric: She has a normal mood and affect. Her behavior is normal. Judgment and thought content normal.  Vitals reviewed.  BP 127/73 (BP Location: Right Arm, Patient Position: Sitting)   Pulse 77   Resp 13   Ht 5\' 2"  (1.575 m)   Wt 228 lb (103.4 kg)   BMI 41.70 kg/m   Past Medical History:  Diagnosis Date  . Anemia   . Arthritis    Social History   Socioeconomic History  . Marital status: Single    Spouse name: Not on file  . Number of children: Not on file  . Years of education: Not on file  . Highest education level: Not on file  Occupational History  . Not on file  Social Needs  .  Financial resource strain: Not on file  . Food insecurity:    Worry: Not on file    Inability: Not on file  . Transportation needs:    Medical: Not on file    Non-medical: Not on file  Tobacco Use  . Smoking status: Never Smoker  . Smokeless tobacco: Never Used  Substance and Sexual Activity  . Alcohol use: Yes    Alcohol/week: 0.0 oz    Comment: RARE  . Drug use: No  . Sexual activity: Yes  Lifestyle  . Physical activity:    Days per week: Not on file    Minutes per session: Not on file  . Stress: Not on  file  Relationships  . Social connections:    Talks on phone: Not on file    Gets together: Not on file    Attends religious service: Not on file    Active member of club or organization: Not on file    Attends meetings of clubs or organizations: Not on file    Relationship status: Not on file  . Intimate partner violence:    Fear of current or ex partner: Not on file    Emotionally abused: Not on file    Physically abused: Not on file    Forced sexual activity: Not on file  Other Topics Concern  . Not on file  Social History Narrative  . Not on file   Past Surgical History:  Procedure Laterality Date  . CESAREAN SECTION     x 2  . JOINT REPLACEMENT Left 04/2017   @ [facility] [place]  . KNEE CLOSED REDUCTION Left 05/28/2017   Procedure: CLOSED MANIPULATION KNEE;  Surgeon: Erin Sons, MD;  Location: [facility] ORS;  Service: Orthopedics;  Laterality: Left;  Marland Kitchen VARICOSE VEIN SURGERY     Family History  Problem Relation Age of Onset  . Diabetes Maternal Uncle   . Cancer Maternal Grandmother   . Alcohol abuse Mother   . Breast cancer Neg Hx    No Known Allergies     Assessment & Plan:  Presents as a new patient referred by Dr. Gavin Potters for left lower extremity edema and discomfort.  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  The patient notes that her swelling has progressively worsened over the last few months.  Her swelling is associated with some discomfort.  The patient notes that her swelling worsens with sitting and standing for long periods of time.  The patient feels that her swelling associated discomfort has progressed to the point that she is unable to function on a daily basis and it has become lifestyle limiting.  The patient has been wearing medical grade 1 compression socks for approximately a month and a half with minimal improvement to her edema.  The patient denies any claudication-like symptoms, rest pain or ulcer  formation to the bilateral lower extremity.  The patient has been variant seeing what she describes as inflammation to the distal calf.  The patient denies any fever, nausea vomiting.  1. Bilateral lower extremity edema - New She presents with worsening edema to the bilateral lower extremity after a left total knee replacement. Edema to the left lower extremity was greater than compared to the right The she has been wearing medical grade 1 compression socks to the left lower extremity with minimal improvement in her symptoms Recommend 3 layer zinc oxide Unna wraps to the left lower extremity changed weekly for approximately 1  month The patient is to elevate her legs as much as possible heart level or higher Patient is to return in 1 month for follow-up Bring the patient back and have her undergo bilateral lower extremity venous duplex to rule out any contributing venous versus lymphatic disease The patient was instructed to call the office in the interim if any worsening edema or ulcerations to the legs, feet or toes occurs. The patient expresses their understanding.  - VAS [place] LOWER EXTREMITY VENOUS REFLUX; Future  2. History of total left knee replacement (TKR) - Stable Contributing factor to the patient's left lower extremity edema  Current Outpatient Medications on File Prior to Visit  Medication Sig Dispense Refill  . meloxicam (MOBIC) 15 MG tablet Take 15 mg by mouth daily as needed for pain.     . naproxen (NAPROSYN) 500 MG tablet Take by mouth.    Marland Kitchen acetaminophen (TYLENOL) 500 MG tablet Take by mouth.    Marland Kitchen ibuprofen (ADVIL,MOTRIN) 200 MG tablet Take by mouth.    . traMADol (ULTRAM) 50 MG tablet Take 50 mg by mouth daily as needed for moderate pain.      No current facility-administered medications on file prior to visit.    There are no Patient Instructions on file for this visit. No follow-ups on file.  Cheresa Siers A Bao Coreas, PA-C

## 2017-10-09 ENCOUNTER — Telehealth (INDEPENDENT_AMBULATORY_CARE_PROVIDER_SITE_OTHER): Payer: Self-pay

## 2017-10-09 NOTE — Telephone Encounter (Signed)
Patient called stating that unna boots did not support well enough  while working on concrete floor and she was having leg pain also.The patient stated that the compressions stockings were more supportive while she work and stated that she has removed the unna boots.Patient will be keeping her follow up appointment to come back in the office.

## 2017-10-15 ENCOUNTER — Encounter (INDEPENDENT_AMBULATORY_CARE_PROVIDER_SITE_OTHER): Payer: BLUE CROSS/BLUE SHIELD

## 2017-10-22 ENCOUNTER — Encounter (INDEPENDENT_AMBULATORY_CARE_PROVIDER_SITE_OTHER): Payer: BLUE CROSS/BLUE SHIELD

## 2017-10-29 ENCOUNTER — Encounter (INDEPENDENT_AMBULATORY_CARE_PROVIDER_SITE_OTHER): Payer: BLUE CROSS/BLUE SHIELD

## 2017-11-04 ENCOUNTER — Ambulatory Visit (INDEPENDENT_AMBULATORY_CARE_PROVIDER_SITE_OTHER): Payer: BLUE CROSS/BLUE SHIELD | Admitting: Vascular Surgery

## 2017-11-04 ENCOUNTER — Encounter (INDEPENDENT_AMBULATORY_CARE_PROVIDER_SITE_OTHER): Payer: Self-pay | Admitting: Vascular Surgery

## 2017-11-04 VITALS — BP 108/66 | HR 75 | Resp 16 | Ht 62.0 in | Wt 226.4 lb

## 2017-11-04 DIAGNOSIS — Z96652 Presence of left artificial knee joint: Secondary | ICD-10-CM | POA: Diagnosis not present

## 2017-11-04 DIAGNOSIS — R600 Localized edema: Secondary | ICD-10-CM | POA: Diagnosis not present

## 2017-11-04 NOTE — Patient Instructions (Signed)

## 2017-11-04 NOTE — Progress Notes (Signed)
MRN : [id number]  Grace Harris is a 52 y.o. (1965/04/22) female who presents with chief complaint of  Chief Complaint  Patient presents with  . Follow-up  .  History of Present Illness: Patient returns today in follow up of leg swelling.  She only use the Unna boot for a couple of days before taking it off.  She says it did not give her enough support to work and stand on concrete all day which is what she was required for work.  She has been wearing her compression stockings.  Her leg still hurts and is swollen more so on the left than the right.  Current Outpatient Medications  Medication Sig Dispense Refill  . acetaminophen (TYLENOL) 500 MG tablet Take by mouth.    Marland Kitchen ibuprofen (ADVIL,MOTRIN) 200 MG tablet Take by mouth.    . meloxicam (MOBIC) 15 MG tablet Take 15 mg by mouth daily as needed for pain.     . naproxen (NAPROSYN) 500 MG tablet Take by mouth.    . traMADol (ULTRAM) 50 MG tablet Take 50 mg by mouth daily as needed for moderate pain.      No current facility-administered medications for this visit.     Past Medical History:  Diagnosis Date  . Anemia   . Arthritis     Past Surgical History:  Procedure Laterality Date  . CESAREAN SECTION     x 2  . JOINT REPLACEMENT Left 04/2017   @ [facility] [place]  . KNEE CLOSED REDUCTION Left 05/28/2017   Procedure: CLOSED MANIPULATION KNEE;  Surgeon: Erin Sons, MD;  Location: [facility] ORS;  Service: Orthopedics;  Laterality: Left;  Marland Kitchen VARICOSE VEIN SURGERY      Social History Social History   Tobacco Use  . Smoking status: Never Smoker  . Smokeless tobacco: Never Used  Substance Use Topics  . Alcohol use: Yes    Alcohol/week: 0.0 standard drinks    Comment: RARE  . Drug use: No    Family History Family History  Problem Relation Age of Onset  . Diabetes Maternal Uncle   . Cancer Maternal Grandmother   . Alcohol abuse Mother   . Breast cancer Neg Hx     No Known Allergies   REVIEW  OF SYSTEMS (Negative unless checked)  Constitutional: [] Weight loss  [] Fever  [] Chills Cardiac: [] Chest pain   [] Chest pressure   [] Palpitations   [] Shortness of breath when laying flat   [] Shortness of breath at rest   [] Shortness of breath with exertion. Vascular:  [] Pain in legs with walking   [] Pain in legs at rest   [] Pain in legs when laying flat   [] Claudication   [] Pain in feet when walking  [] Pain in feet at rest  [] Pain in feet when laying flat   [] History of DVT   [] Phlebitis   [x] Swelling in legs   [] Varicose veins   [] Non-healing ulcers Pulmonary:   [] Uses home oxygen   [] Productive cough   [] Hemoptysis   [] Wheeze  [] COPD   [] Asthma Neurologic:  [] Dizziness  [] Blackouts   [] Seizures   [] History of stroke   [] History of TIA  [] Aphasia   [] Temporary blindness   [] Dysphagia   [] Weakness or numbness in arms   [] Weakness or numbness in legs Musculoskeletal:  [x] Arthritis   [] Joint swelling   [x] Joint pain   [] Low back pain Hematologic:  [] Easy bruising  [] Easy bleeding   [] Hypercoagulable state   [] Anemic   Gastrointestinal:  [] Blood  in stool   [] Vomiting blood  [] Gastroesophageal reflux/heartburn   [] Abdominal pain Genitourinary:  [] Chronic kidney disease   [] Difficult urination  [] Frequent urination  [] Burning with urination   [] Hematuria Skin:  [] Rashes   [] Ulcers   [] Wounds Psychological:  [] History of anxiety   []  History of major depression.  Physical Examination  BP 108/66 (BP Location: Right Arm)   Pulse 75   Resp 16   Ht 5\' 2"  (1.575 m)   Wt 226 lb 6.4 oz (102.7 kg)   BMI 41.41 kg/m  Gen:  WD/WN, NAD Head: [place]/AT, No temporalis wasting. Ear/Nose/Throat: Hearing grossly intact, nares w/o erythema or drainage Eyes: Conjunctiva clear. Sclera non-icteric Neck: Supple.  Trachea midline Pulmonary:  Good air movement, no use of accessory muscles.  Cardiac: RRR, no JVD Vascular:  Vessel Right Left  Radial Palpable Palpable                          PT 1+ Palpable Not  Palpable  DP 1+ Palpable 1+ Palpable    Musculoskeletal: M/S 5/5 throughout.  No deformity or atrophy.  1+ right lower extremity edema, 2-3+ left lower extremity edema. Neurologic: Sensation grossly intact in extremities.  Symmetrical.  Speech is fluent.  Psychiatric: Judgment intact, Mood & affect appropriate for pt's clinical situation. Dermatologic: No rashes or ulcers noted.  No cellulitis or open wounds.  Significant stasis dermatitis changes are present in the left lower extremity       Labs No results found for this or any previous visit (from the past 2160 hour(s)).  Radiology No results found.  Assessment/Plan  History of total left knee replacement (TKR) Swelling did worsen after the knee replacement.  May be a component of lymphedema with chronic scarring the lymphatic channels.  Bilateral lower extremity edema Wearing her compression stockings and elevating her legs.  We will go ahead and try to get her a venous reflux study in the near future at her convenience.  We will see her back following the study to discuss the results and determine further treatment options.    Festus Barren, MD  11/04/2017 4:44 PM    This note was created with Dragon medical transcription system.  Any errors from dictation are purely unintentional

## 2017-11-04 NOTE — Assessment & Plan Note (Signed)
Swelling did worsen after the knee replacement.  May be a component of lymphedema with chronic scarring the lymphatic channels.

## 2017-11-04 NOTE — Assessment & Plan Note (Signed)
Wearing her compression stockings and elevating her legs.  We will go ahead and try to get her a venous reflux study in the near future at her convenience.  We will see her back following the study to discuss the results and determine further treatment options.

## 2017-11-05 ENCOUNTER — Ambulatory Visit (INDEPENDENT_AMBULATORY_CARE_PROVIDER_SITE_OTHER): Payer: BLUE CROSS/BLUE SHIELD

## 2017-11-05 DIAGNOSIS — R600 Localized edema: Secondary | ICD-10-CM

## 2018-03-13 ENCOUNTER — Ambulatory Visit (INDEPENDENT_AMBULATORY_CARE_PROVIDER_SITE_OTHER): Payer: Self-pay | Admitting: Vascular Surgery

## 2018-03-13 ENCOUNTER — Encounter (INDEPENDENT_AMBULATORY_CARE_PROVIDER_SITE_OTHER): Payer: Self-pay

## 2018-03-13 ENCOUNTER — Encounter

## 2018-03-15 ENCOUNTER — Other Ambulatory Visit: Payer: Self-pay

## 2018-03-15 ENCOUNTER — Ambulatory Visit
Admission: EM | Admit: 2018-03-15 | Discharge: 2018-03-15 | Disposition: A | Discharge status: Home / Self Care | Payer: BC Managed Care – PPO | Attending: Physician Assistant | Admitting: Physician Assistant

## 2018-03-15 ENCOUNTER — Encounter: Payer: Self-pay | Admitting: Gynecology

## 2018-03-15 DIAGNOSIS — J069 Acute upper respiratory infection, unspecified: Secondary | ICD-10-CM | POA: Insufficient documentation

## 2018-03-15 DIAGNOSIS — R519 Headache, unspecified: Secondary | ICD-10-CM

## 2018-03-15 DIAGNOSIS — R51 Headache: Secondary | ICD-10-CM | POA: Diagnosis not present

## 2018-03-15 MED ORDER — PREDNISONE 20 MG PO TABS: 40.0000 mg | ORAL_TABLET | Freq: Every day | ORAL | 0 refills | Status: AC

## 2018-03-15 NOTE — Discharge Instructions (Addendum)
Reviewed use of a nasal saline irrigation system. May consider use of intranasal steroids, such as Flonase as well. Use medications as directed. If antibiotics are prescribed, take the full course of antibiotics. Antibiotics are not indicated at this time as you have only been ill for 3 days, all vitals normal, and exam not suggestive of bacterial infection. You can try prednisone for a few days to help. Also consider use of Sudafed or Mucinex D. Increase rest, fluids. May take Motrin and Tylenol for headache. If you do not improve or if you worsen after a course of antibiotics, you should be re-examined. You may need a different antibiotic or further evaluation with imaging or an exam of the inside of the sinuses

## 2018-03-15 NOTE — ED Provider Notes (Signed)
[facility]    CSN: [id number] Arrival date & time: 03/15/18  0830     History   Chief Complaint Chief Complaint  Patient presents with  . Sinusitis    HPI Grace Harris is a 53 y.o. female. African American female presents for 3 day history of sinus congestion/pressure, frontal headaches, and nasal congestion. Also says that her eyes have been watering, but are not red or hurting. She has been taking Sudafed and using Flonase w/o much relief. Denies fevers and cough. She says she feels like she has a sinus infection. She has no other concerns today.  HPI  Past Medical History:  Diagnosis Date  . Anemia   . Arthritis     Patient Active Problem List   Diagnosis Date Noted  . Bilateral lower extremity edema 10/07/2017  . History of total left knee replacement (TKR) 10/07/2017    Past Surgical History:  Procedure Laterality Date  . CESAREAN SECTION     x 2  . JOINT REPLACEMENT Left 04/2017   @ [facility] IN [place]  . KNEE CLOSED REDUCTION Left 05/28/2017   Procedure: CLOSED MANIPULATION KNEE;  Surgeon: Erin Sons, MD;  Location: [facility] ORS;  Service: Orthopedics;  Laterality: Left;  Marland Kitchen VARICOSE VEIN SURGERY      OB History   No obstetric history on file.      Home Medications    Prior to Admission medications   Medication Sig Start Date End Date Taking? Authorizing Provider  ibuprofen (ADVIL,MOTRIN) 200 MG tablet Take by mouth.   Yes [provider]  meloxicam (MOBIC) 15 MG tablet Take 15 mg by mouth daily as needed for pain.    Yes [provider]  naproxen (NAPROSYN) 500 MG tablet Take by mouth. 08/29/17  Yes [provider]  acetaminophen (TYLENOL) 500 MG tablet Take by mouth.    [provider]  predniSONE (DELTASONE) 20 MG tablet Take 2 tablets (40 mg total) by mouth daily for 5 days. 03/15/18 03/20/18  Shirlee Latch, PA-C  traMADol (ULTRAM) 50 MG tablet Take 50 mg by mouth daily as needed  for moderate pain.     [provider]    Family History Family History  Problem Relation Age of Onset  . Diabetes Maternal Uncle   . Cancer Maternal Grandmother   . Alcohol abuse Mother   . Breast cancer Neg Hx     Social History Social History   Tobacco Use  . Smoking status: Never Smoker  . Smokeless tobacco: Never Used  Substance Use Topics  . Alcohol use: Yes    Alcohol/week: 0.0 standard drinks    Comment: RARE  . Drug use: No     Allergies   Patient has no known allergies.   Review of Systems Review of Systems  Constitutional: Negative for chills, fatigue and fever.  HENT: Positive for congestion, postnasal drip, rhinorrhea, sinus pressure and sore throat (mild). Negative for ear pain, sinus pain and trouble swallowing.   Eyes: Positive for discharge (clear). Negative for pain and redness.  Respiratory: Negative for cough, shortness of breath and wheezing.   Cardiovascular: Negative for chest pain.  Gastrointestinal: Negative for abdominal pain, nausea and vomiting.  Musculoskeletal: Negative for arthralgias and myalgias.  Skin: Negative for color change and rash.  Allergic/Immunologic: Positive for environmental allergies.  Neurological: Positive for headaches. Negative for dizziness and weakness.  Hematological: Negative for adenopathy.     Physical Exam Triage Vital Signs ED Triage Vitals  Enc Vitals Group     BP 03/15/18 0840 127/76     Pulse Rate 03/15/18 0840 66     Resp 03/15/18 0840 18     Temp 03/15/18 0840 98 F (36.7 C)     Temp Source 03/15/18 0840 Oral     SpO2 03/15/18 0840 100 %     Weight 03/15/18 0840 231 lb (104.8 kg)     Height 03/15/18 0840 5\' 2"  (1.575 m)     Head Circumference --      Peak Flow --      Pain Score 03/15/18 0839 7     Pain Loc --      Pain Edu? --      Excl. in GC? --    No data found.  Updated Vital Signs BP 127/76 (BP Location: Left Arm)   Pulse 66   Temp 98 F (36.7 C) (Oral)   Resp 18    Ht 5\' 2"  (1.575 m)   Wt 231 lb (104.8 kg)   LMP 03/04/2018   SpO2 100%   BMI 42.25 kg/m   Visual Acuity Right Eye Distance:   Left Eye Distance:   Bilateral Distance:    Right Eye Near:   Left Eye Near:    Bilateral Near:     Physical Exam Vitals signs and nursing note reviewed.  Constitutional:      General: She is not in acute distress.    Appearance: She is obese.  HENT:     Head: Normocephalic and atraumatic.     Right Ear: Tympanic membrane, ear canal and external ear normal.     Left Ear: Tympanic membrane, ear canal and external ear normal.     Nose: Mucosal edema, congestion and rhinorrhea (moderate purulent drainage w/o swollen nasal mucosa) present.     Right Sinus: No maxillary sinus tenderness or frontal sinus tenderness.     Left Sinus: No maxillary sinus tenderness or frontal sinus tenderness.     Mouth/Throat:     Mouth: Mucous membranes are moist.     Pharynx: Oropharynx is clear.  Eyes:     General: No scleral icterus.       Right eye: No discharge.        Left eye: No discharge.     Conjunctiva/sclera: Conjunctivae normal.  Neck:     Musculoskeletal: Neck supple.  Cardiovascular:     Rate and Rhythm: Normal rate and regular rhythm.     Heart sounds: No murmur.  Pulmonary:     Effort: Pulmonary effort is normal. No respiratory distress.     Breath sounds: Normal breath sounds. No wheezing, rhonchi or rales.  Lymphadenopathy:     Cervical: No cervical adenopathy.  Skin:    General: Skin is warm and dry.     Findings: No rash.  Neurological:     General: No focal deficit present.     Mental Status: She is alert and oriented to person, place, and time.     Motor: No weakness.     Gait: Gait normal.  Psychiatric:        Mood and Affect: Mood normal.        Behavior: Behavior normal.        Thought Content: Thought content normal.      UC Treatments / Results  Labs (all labs ordered are listed, but only abnormal results are displayed) Labs  Reviewed - No data to display  EKG None  Radiology No results found.  Procedures Procedures (including critical care time)  Medications Ordered in UC Medications - No data to display  Initial Impression / Assessment and Plan / UC Course  I have reviewed the triage vital signs and the nursing notes.  Pertinent labs & imaging results that were available during my care of the patient were reviewed by me and considered in my medical decision making (see chart for details).     Final Clinical Impressions(s) / UC Diagnoses   Final diagnoses:  Acute URI  Acute nonintractable headache, unspecified headache type     Discharge Instructions     Reviewed use of a nasal saline irrigation system. May consider use of intranasal steroids, such as Flonase as well. Use medications as directed. If antibiotics are prescribed, take the full course of antibiotics. Antibiotics are not indicated at this time as you have only been ill for 3 days, all vitals normal, and exam not suggestive of bacterial infection. You can try prednisone for a few days to help. Also consider use of Sudafed or Mucinex D. Increase rest, fluids. May take Motrin and Tylenol for headache. If you do not improve or if you worsen after a course of antibiotics, you should be re-examined. You may need a different antibiotic or further evaluation with imaging or an exam of the inside of the sinuses     ED Prescriptions    Medication Sig Dispense Auth. Provider   predniSONE (DELTASONE) 20 MG tablet Take 2 tablets (40 mg total) by mouth daily for 5 days. 10 tablet Shirlee Latch, PA-C     Controlled Substance Prescriptions [place] Controlled Substance Registry consulted? Not Applicable   Gareth Morgan 03/15/18 0321

## 2018-03-15 NOTE — ED Triage Notes (Signed)
Patient c/o nasal drip/ running eyes and headache x 3 days

## 2018-04-14 ENCOUNTER — Encounter (INDEPENDENT_AMBULATORY_CARE_PROVIDER_SITE_OTHER): Payer: Self-pay | Admitting: Vascular Surgery

## 2018-04-14 ENCOUNTER — Ambulatory Visit (INDEPENDENT_AMBULATORY_CARE_PROVIDER_SITE_OTHER): Payer: BC Managed Care – PPO | Admitting: Vascular Surgery

## 2018-04-14 VITALS — BP 123/80 | HR 85 | Resp 14 | Ht 62.0 in | Wt 217.8 lb

## 2018-04-14 DIAGNOSIS — I8312 Varicose veins of left lower extremity with inflammation: Secondary | ICD-10-CM | POA: Insufficient documentation

## 2018-04-14 NOTE — Progress Notes (Signed)
CELENIA PARAMO is a 53 y.o. female who presents with symptomatic venous reflux  Past Medical History:  Diagnosis Date  . Anemia   . Arthritis     Past Surgical History:  Procedure Laterality Date  . CESAREAN SECTION     x 2  . JOINT REPLACEMENT Left 04/2017   @ [facility] [place]  . KNEE CLOSED REDUCTION Left 05/28/2017   Procedure: CLOSED MANIPULATION KNEE;  Surgeon: Erin Sons, MD;  Location: [facility] ORS;  Service: Orthopedics;  Laterality: Left;  Marland Kitchen VARICOSE VEIN SURGERY       Current Outpatient Medications:  .  meloxicam (MOBIC) 15 MG tablet, Take 15 mg by mouth daily as needed for pain. , Disp: , Rfl:  .  naproxen (NAPROSYN) 500 MG tablet, Take by mouth., Disp: , Rfl:  .  traMADol (ULTRAM) 50 MG tablet, Take 50 mg by mouth daily as needed for moderate pain. , Disp: , Rfl:   No Known Allergies   Varicose veins of left lower extremity with inflammation     PLAN: The patient's left lower extremity was sterilely prepped and draped. The ultrasound machine was used to visualize the saphenous vein throughout its course. A segment in the in the upper calf was selected for access. The saphenous vein was accessed without difficulty using ultrasound guidance with a micropuncture needle. A 0.018 wire was then placed.  The wire would not traverse beyond the mid thigh and by ultrasound the vein appeared to be occluded at this location.  It had been almost 6 months since her ultrasound, and she likely had some sort of thrombophlebitis or other situation in this location that caused her to occlude in the interim. The 65 cm sheath was then placed over the wire and the wire and dilator were removed. The laser fiber was then placed through the sheath and its tip was placed the point in the mid thigh where the vein appeared to be occluded. Tumescent anesthesia was then created with a dilute lidocaine solution. Laser energy was then delivered with constant withdrawal of the sheath  and laser fiber. Approximately 585 joules of energy were delivered over a length of 17 centimeters using a 1470 Hz VenaCure machine at 7 W. By ultrasound, when the vein reopened in the proximal thigh it was relatively close to the saphenofemoral junction and I did not feel there was sufficient length to perform laser ablation safely in this location.  Any residual varicosities can be treated with foam sclerotherapy at a later date.  Sterile dressings were placed. The patient tolerated the procedure well without obvious complications.   Follow-up in 1 week with post-laser duplex.

## 2018-04-17 ENCOUNTER — Ambulatory Visit (INDEPENDENT_AMBULATORY_CARE_PROVIDER_SITE_OTHER): Payer: BC Managed Care – PPO

## 2018-04-17 DIAGNOSIS — I8312 Varicose veins of left lower extremity with inflammation: Secondary | ICD-10-CM

## 2018-05-12 ENCOUNTER — Ambulatory Visit (INDEPENDENT_AMBULATORY_CARE_PROVIDER_SITE_OTHER): Payer: BC Managed Care – PPO | Admitting: Nurse Practitioner

## 2018-09-09 ENCOUNTER — Other Ambulatory Visit: Payer: Self-pay

## 2018-09-09 ENCOUNTER — Encounter: Payer: Self-pay | Admitting: Family Medicine

## 2018-09-09 ENCOUNTER — Other Ambulatory Visit (HOSPITAL_COMMUNITY)
Admission: RE | Admit: 2018-09-09 | Discharge: 2018-09-09 | Disposition: A | Discharge status: Home / Self Care | Payer: BC Managed Care – PPO | Source: Ambulatory Visit | Attending: Family Medicine | Admitting: Family Medicine

## 2018-09-09 ENCOUNTER — Ambulatory Visit (INDEPENDENT_AMBULATORY_CARE_PROVIDER_SITE_OTHER): Payer: BC Managed Care – PPO | Admitting: Family Medicine

## 2018-09-09 VITALS — BP 120/64 | HR 80 | Ht 62.0 in | Wt 217.0 lb

## 2018-09-09 DIAGNOSIS — Z0000 Encounter for general adult medical examination without abnormal findings: Secondary | ICD-10-CM | POA: Diagnosis not present

## 2018-09-09 DIAGNOSIS — Z01419 Encounter for gynecological examination (general) (routine) without abnormal findings: Secondary | ICD-10-CM | POA: Insufficient documentation

## 2018-09-09 DIAGNOSIS — Z1211 Encounter for screening for malignant neoplasm of colon: Secondary | ICD-10-CM

## 2018-09-09 DIAGNOSIS — Z124 Encounter for screening for malignant neoplasm of cervix: Secondary | ICD-10-CM | POA: Diagnosis present

## 2018-09-09 DIAGNOSIS — E66812 Obesity, class 2: Secondary | ICD-10-CM

## 2018-09-09 DIAGNOSIS — Z1231 Encounter for screening mammogram for malignant neoplasm of breast: Secondary | ICD-10-CM

## 2018-09-09 DIAGNOSIS — Z6839 Body mass index (BMI) 39.0-39.9, adult: Secondary | ICD-10-CM

## 2018-09-09 DIAGNOSIS — E6609 Other obesity due to excess calories: Secondary | ICD-10-CM

## 2018-09-09 LAB — HEMOCCULT GUIAC POC 1CARD (OFFICE): Fecal Occult Blood, POC: NEGATIVE

## 2018-09-09 NOTE — Patient Instructions (Signed)
Mediterranean Diet A Mediterranean diet refers to food and lifestyle choices that are based on the traditions of countries located on the [employer]. This way of eating has been shown to help prevent certain conditions and improve outcomes for people who have chronic diseases, like kidney disease and heart disease. What are tips for following this plan? Lifestyle  Cook and eat meals together with your family, when possible.  Drink enough fluid to keep your urine clear or pale yellow.  Be physically active every day. This includes: ? Aerobic exercise like running or swimming. ? Leisure activities like gardening, walking, or housework.  Get 7-8 hours of sleep each night.  If recommended by your health care provider, drink red wine in moderation. This means 1 glass a day for nonpregnant women and 2 glasses a day for men. A glass of wine equals 5 oz (150 mL). Reading food labels   Check the serving size of packaged foods. For foods such as rice and pasta, the serving size refers to the amount of cooked product, not dry.  Check the total fat in packaged foods. Avoid foods that have saturated fat or trans fats.  Check the ingredients list for added sugars, such as corn syrup. Shopping  At the grocery store, buy most of your food from the areas near the walls of the store. This includes: ? Fresh fruits and vegetables (produce). ? Grains, beans, nuts, and seeds. Some of these may be available in unpackaged forms or large amounts (in bulk). ? Fresh seafood. ? Poultry and eggs. ? Low-fat dairy products.  Buy whole ingredients instead of prepackaged foods.  Buy fresh fruits and vegetables in-season from local farmers markets.  Buy frozen fruits and vegetables in resealable bags.  If you do not have access to quality fresh seafood, buy precooked frozen shrimp or canned fish, such as tuna, salmon, or sardines.  Buy small amounts of raw or cooked vegetables, salads, or olives from  the deli or salad bar at your store.  Stock your pantry so you always have certain foods on hand, such as olive oil, canned tuna, canned tomatoes, rice, pasta, and beans. Cooking  Cook foods with extra-virgin olive oil instead of using butter or other vegetable oils.  Have meat as a side dish, and have vegetables or grains as your main dish. This means having meat in small portions or adding small amounts of meat to foods like pasta or stew.  Use beans or vegetables instead of meat in common dishes like chili or lasagna.  Experiment with different cooking methods. Try roasting or broiling vegetables instead of steaming or sauteing them.  Add frozen vegetables to soups, stews, pasta, or rice.  Add nuts or seeds for added healthy fat at each meal. You can add these to yogurt, salads, or vegetable dishes.  Marinate fish or vegetables using olive oil, lemon juice, garlic, and fresh herbs. Meal planning   Plan to eat 1 vegetarian meal one day each week. Try to work up to 2 vegetarian meals, if possible.  Eat seafood 2 or more times a week.  Have healthy snacks readily available, such as: ? Vegetable sticks with hummus. ? [place] yogurt. ? Fruit and nut trail mix.  Eat balanced meals throughout the week. This includes: ? Fruit: 2-3 servings a day ? Vegetables: 4-5 servings a day ? Low-fat dairy: 2 servings a day ? Fish, poultry, or lean meat: 1 serving a day ? Beans and legumes: 2 or more servings a week ?  Nuts and seeds: 1-2 servings a day ? Whole grains: 6-8 servings a day ? Extra-virgin olive oil: 3-4 servings a day  Limit red meat and sweets to only a few servings a month What are my food choices?  Mediterranean diet ? Recommended  Grains: Whole-grain pasta. Brown rice. Bulgar wheat. Polenta. Couscous. Whole-wheat bread. [place].  Vegetables: Artichokes. Beets. Broccoli. Cabbage. Carrots. Eggplant. Green beans. Chard. Kale. Spinach. Onions. Leeks. Peas. Squash.  Tomatoes. Peppers. Radishes.  Fruits: Apples. Apricots. Avocado. Berries. Bananas. Cherries. Dates. Figs. Grapes. Lemons. Melon. Oranges. Peaches. Plums. Pomegranate.  Meats and other protein foods: Beans. Almonds. Sunflower seeds. Pine nuts. Peanuts. [place]. Salmon. Scallops. Shrimp. [place]. Tilapia. Clams. Oysters. Eggs.  Dairy: Low-fat milk. Cheese. Greek yogurt.  Beverages: Water. Red wine. Herbal tea.  Fats and oils: Extra virgin olive oil. Avocado oil. Grape seed oil.  Sweets and desserts: [place] yogurt with honey. Baked apples. Poached pears. Trail mix.  Seasoning and other foods: Basil. Cilantro. Coriander. Cumin. Mint. Parsley. Sage. Rosemary. Tarragon. Garlic. Oregano. Thyme. Pepper. Balsalmic vinegar. Tahini. Hummus. Tomato sauce. Olives. Mushrooms. ? Limit these  Grains: Prepackaged pasta or rice dishes. Prepackaged cereal with added sugar.  Vegetables: Deep fried potatoes (french fries).  Fruits: Fruit canned in syrup.  Meats and other protein foods: Beef. Pork. Lamb. Poultry with skin. Hot dogs. Berniece Salines.  Dairy: Ice cream. Sour cream. Whole milk.  Beverages: Juice. Sugar-sweetened soft drinks. Beer. Liquor and spirits.  Fats and oils: Butter. Canola oil. Vegetable oil. Beef fat (tallow). Lard.  Sweets and desserts: Cookies. Cakes. Pies. Candy.  Seasoning and other foods: Mayonnaise. Premade sauces and marinades. The items listed may not be a complete list. Talk with your dietitian about what dietary choices are right for you. Summary  The Mediterranean diet includes both food and lifestyle choices.  Eat a variety of fresh fruits and vegetables, beans, nuts, seeds, and whole grains.  Limit the amount of red meat and sweets that you eat.  Talk with your health care provider about whether it is safe for you to drink red wine in moderation. This means 1 glass a day for nonpregnant women and 2 glasses a day for men. A glass of wine equals 5 oz (150 mL). This information  is not intended to replace advice given to you by your health care provider. Make sure you discuss any questions you have with your health care provider. Document Released: 10/12/2015 Document Revised: 10/19/2015 Document Reviewed: 10/12/2015 Elsevier Patient Education  2020 [employer].    Why follow it? Research shows. . Those who follow the Mediterranean diet have a reduced risk of heart disease  . The diet is associated with a reduced incidence of Parkinson's and Alzheimer's diseases . People following the diet may have longer life expectancies and lower rates of chronic diseases  . The Dietary Guidelines for Americans recommends the Mediterranean diet as an eating plan to promote health and prevent disease  What Is the Mediterranean Diet?  . Healthy eating plan based on typical foods and recipes of Mediterranean-style cooking . The diet is primarily a plant based diet; these foods should make up a majority of meals   Starches - Plant based foods should make up a majority of meals - They are an important sources of vitamins, minerals, energy, antioxidants, and fiber - Choose whole grains, foods high in fiber and minimally processed items  - Typical grain sources include wheat, oats, barley, corn, brown rice, bulgar, farro, millet, polenta, couscous  - Various types  of beans include chickpeas, lentils, fava beans, black beans, white beans   Fruits  Veggies - Large quantities of antioxidant rich fruits & veggies; 6 or more servings  - Vegetables can be eaten raw or lightly drizzled with oil and cooked  - Vegetables common to the traditional Mediterranean Diet include: artichokes, arugula, beets, broccoli, brussel sprouts, cabbage, carrots, celery, collard greens, cucumbers, eggplant, kale, leeks, lemons, lettuce, mushrooms, okra, onions, peas, peppers, potatoes, pumpkin, radishes, rutabaga, shallots, spinach, sweet potatoes, turnips, zucchini - Fruits common to the Mediterranean Diet  include: apples, apricots, avocados, cherries, clementines, dates, figs, grapefruits, grapes, melons, nectarines, oranges, peaches, pears, pomegranates, strawberries, tangerines  Fats - Replace butter and margarine with healthy oils, such as olive oil, canola oil, and tahini  - Limit nuts to no more than a handful a day  - Nuts include walnuts, almonds, pecans, pistachios, pine nuts  - Limit or avoid candied, honey roasted or heavily salted nuts - Olives are central to the [employer]iterranean diet - can be eaten whole or used in a variety of dishes   Meats Protein - Limiting red meat: no more than a few times a month - When eating red meat: choose lean cuts and keep the portion to the size of deck of cards - Eggs: approx. 0 to 4 times a week  - Fish and lean poultry: at least 2 a week  - Healthy protein sources include, chicken, [place], lean beef, lamb - Increase intake of seafood such as tuna, salmon, trout, mackerel, shrimp, scallops - Avoid or limit high fat processed meats such as sausage and bacon  Dairy - Include moderate amounts of low fat dairy products  - Focus on healthy dairy such as fat free yogurt, skim milk, low or reduced fat cheese - Limit dairy products higher in fat such as whole or 2% milk, cheese, ice cream  Alcohol - Moderate amounts of red wine is ok  - No more than 5 oz daily for women (all ages) and men older than age 51  - No more than 10 oz of wine daily for men younger than 2365  Other - Limit sweets and other desserts  - Use herbs and spices instead of salt to flavor foods  - Herbs and spices common to the traditional Mediterranean Diet include: basil, bay leaves, chives, cloves, cumin, fennel, garlic, lavender, marjoram, mint, oregano, parsley, pepper, rosemary, sage, savory, sumac, tarragon, thyme   It's not just a diet, it's a lifestyle:  . The Mediterranean diet includes lifestyle factors typical of those in the region  . Foods, drinks and meals are best eaten with  others and savored . Daily physical activity is important for overall good health . This could be strenuous exercise like running and aerobics . This could also be more leisurely activities such as walking, housework, yard-work, or taking the stairs . Moderation is the key; a balanced and healthy diet accommodates most foods and drinks . Consider portion sizes and frequency of consumption of certain foods   Meal Ideas & Options:  . Breakfast:  o Whole wheat toast or whole wheat English muffins with peanut butter & hard boiled egg o Steel cut oats topped with apples & cinnamon and skim milk  o Fresh fruit: banana, strawberries, melon, berries, peaches  o Smoothies: strawberries, bananas, greek yogurt, peanut butter o Low fat greek yogurt with blueberries and granola  o Egg white omelet with spinach and mushrooms o Breakfast couscous: whole wheat couscous, apricots, skim milk, cranberries  .  Sandwiches:  o Hummus and grilled vegetables (peppers, zucchini, squash) on whole wheat bread   o Grilled chicken on whole wheat pita with lettuce, tomatoes, cucumbers or tzatziki  o Tuna salad on whole wheat bread: tuna salad made with greek yogurt, olives, red peppers, capers, green onions o Garlic rosemary lamb pita: lamb sauted with garlic, rosemary, salt & pepper; add lettuce, cucumber, greek yogurt to pita - flavor with lemon juice and black pepper  . Seafood:  o Mediterranean grilled salmon, seasoned with garlic, basil, parsley, lemon juice and black pepper o Shrimp, lemon, and spinach whole-grain pasta salad made with low fat greek yogurt  o Seared scallops with lemon orzo  o Seared tuna steaks seasoned salt, pepper, coriander topped with tomato mixture of olives, tomatoes, olive oil, minced garlic, parsley, green onions and cappers  . Meats:  o Herbed greek chicken salad with kalamata olives, cucumber, feta  o Red bell peppers stuffed with spinach, bulgur, lean ground beef (or lentils) &  topped with feta   o Kebabs: skewers of chicken, tomatoes, onions, zucchini, squash  o [place] burgers: made with red onions, mint, dill, lemon juice, feta cheese topped with roasted red peppers . Vegetarian o Cucumber salad: cucumbers, artichoke hearts, celery, red onion, feta cheese, tossed in olive oil & lemon juice  o Hummus and whole grain pita points with a greek salad (lettuce, tomato, feta, olives, cucumbers, red onion) o Lentil soup with celery, carrots made with vegetable broth, garlic, salt and pepper  o Tabouli salad: parsley, bulgur, mint, scallions, cucumbers, tomato, radishes, lemon juice, olive oil, salt and pepper.

## 2018-09-09 NOTE — Progress Notes (Signed)
Date:  09/09/2018   Name:  Grace Harris   DOB:  09-May-1965   MRN:  [id number]   Chief Complaint: Annual Exam  Patient is a 53 year old female who presents for a comprehensive physical exam. The patient reports the following problems: none. Health maintenance has been reviewed up to date.   Review of Systems  Constitutional: Negative for chills, fatigue, fever and unexpected weight change.  HENT: Negative for drooling, ear discharge, ear pain, rhinorrhea, sinus pressure, sinus pain, sore throat and tinnitus.   Eyes: Negative for pain, redness and itching.  Respiratory: Negative for cough, chest tightness, shortness of breath, wheezing and stridor.   Cardiovascular: Negative for chest pain, palpitations and leg swelling.  Gastrointestinal: Negative for abdominal pain, blood in stool, constipation, diarrhea, nausea and rectal pain.  Endocrine: Negative for polydipsia.  Genitourinary: Negative for decreased urine volume, dysuria, frequency, hematuria, urgency, vaginal bleeding, vaginal discharge and vaginal pain.  Musculoskeletal: Negative for back pain, myalgias and neck pain.  Skin: Negative for rash.  Allergic/Immunologic: Negative for environmental allergies.  Neurological: Negative for dizziness, tremors and headaches.  Hematological: Does not bruise/bleed easily.  Psychiatric/Behavioral: Negative for suicidal ideas. The patient is not nervous/anxious.     Patient Active Problem List   Diagnosis Date Noted  . Varicose veins of left lower extremity with inflammation 04/14/2018  . Bilateral lower extremity edema 10/07/2017  . History of total left knee replacement (TKR) 10/07/2017    No Known Allergies  Past Surgical History:  Procedure Laterality Date  . CESAREAN SECTION     x 2  . JOINT REPLACEMENT Left 04/2017   [facility] [place]  . KNEE CLOSED REDUCTION Left 05/28/2017   Procedure: CLOSED MANIPULATION KNEE;  Surgeon: Erin SonsKernodle, Harold, MD;   Location: [facility] ORS;  Service: Orthopedics;  Laterality: Left;  Marland Kitchen. VARICOSE VEIN SURGERY      Social History   Tobacco Use  . Smoking status: Never Smoker  . Smokeless tobacco: Never Used  Substance Use Topics  . Alcohol use: Yes    Alcohol/week: 0.0 standard drinks    Comment: RARE  . Drug use: No     Medication list has been reviewed and updated.  No outpatient medications have been marked as taking for the 09/09/18 encounter (Office Visit) with Duanne LimerickJones,  C, MD.    [facility] L[facility]Q 2/9 Scores 09/09/2018 04/09/2017 07/05/2015  PHQ - 2 Score 0 0 0  PHQ- 9 Score 1 0 -    BP Readings from Last 3 Encounters:  09/09/18 120/64  04/14/18 123/80  03/15/18 127/76    Physical Exam Vitals signs and nursing note reviewed.  Constitutional:      Appearance: Normal appearance. She is well-developed and well-groomed. She is obese.  HENT:     Head: Normocephalic.     Jaw: There is normal jaw occlusion.     Right Ear: Hearing, tympanic membrane, ear canal and external ear normal. There is no impacted cerumen.     Left Ear: Hearing, tympanic membrane, ear canal and external ear normal. There is no impacted cerumen.     Nose: Nose normal. No congestion or rhinorrhea.     Right Turbinates: Not swollen.     Left Turbinates: Not enlarged or swollen.     Mouth/Throat:     Lips: Pink.     Mouth: Mucous membranes are moist.     Palate: No mass and lesions.  Eyes:     General: Lids are normal. Lids  are everted, no foreign bodies appreciated. Vision grossly intact. Gaze aligned appropriately. No visual field deficit or scleral icterus.       Right eye: No foreign body, discharge or hordeolum.        Left eye: No foreign body, discharge or hordeolum.     Extraocular Movements: Extraocular movements intact.     Right eye: Normal extraocular motion.     Left eye: Normal extraocular motion.     Conjunctiva/sclera: Conjunctivae normal.     Right eye: Right conjunctiva is not injected.     Left eye: Left  conjunctiva is not injected.     Pupils: Pupils are equal, round, and reactive to light.  Neck:     Musculoskeletal: Full passive range of motion without pain, normal range of motion and neck supple.     Thyroid: No thyroid mass, thyromegaly or thyroid tenderness.     Vascular: No JVD.     Trachea: No tracheal deviation.  Cardiovascular:     Rate and Rhythm: Normal rate and regular rhythm.     Chest Wall: PMI is not displaced. No thrill.     Pulses: Normal pulses.          Carotid pulses are 2+ on the right side and 2+ on the left side.      Radial pulses are 2+ on the right side and 2+ on the left side.       Femoral pulses are 2+ on the right side and 2+ on the left side.      Popliteal pulses are 2+ on the right side and 2+ on the left side.       Dorsalis pedis pulses are 2+ on the right side and 2+ on the left side.       Posterior tibial pulses are 2+ on the right side and 2+ on the left side.     Heart sounds: S1 normal and S2 normal. No murmur. No systolic murmur. No diastolic murmur. No friction rub. No gallop. No S3 or S4 sounds.   Pulmonary:     Effort: Pulmonary effort is normal. No respiratory distress.     Breath sounds: Normal breath sounds. No stridor or transmitted upper airway sounds. No decreased breath sounds, wheezing, rhonchi or rales.  Chest:     Chest wall: No mass, lacerations or tenderness.     Breasts:        Right: Normal. No swelling, bleeding, inverted nipple, mass, nipple discharge, skin change or tenderness.        Left: Normal. No swelling, bleeding, inverted nipple, mass, nipple discharge, skin change or tenderness.  Abdominal:     General: Bowel sounds are normal.     Palpations: Abdomen is soft. There is no hepatomegaly, splenomegaly or mass.     Tenderness: There is no abdominal tenderness. There is no guarding or rebound.  Genitourinary:    Exam position: Lithotomy position.     Pubic Area: No rash.      Labia:        Right: No rash, tenderness  or lesion.        Left: No rash, tenderness or lesion.      Urethra: No urethral swelling or urethral lesion.     Vagina: Normal.     Cervix: Normal.     Uterus: Normal. Not deviated and not enlarged.      Adnexa: Right adnexa normal and left adnexa normal.       Right: No mass.  Left: No mass.       Rectum: Normal. Guaiac result negative. No mass.  Musculoskeletal: Normal range of motion.        General: No tenderness.     Lumbar back: Normal.     Right lower leg: No edema.     Left lower leg: No edema.  Lymphadenopathy:     Head:     Right side of head: No submental or submandibular adenopathy.     Left side of head: No submental or submandibular adenopathy.     Cervical: No cervical adenopathy.     Right cervical: No superficial, deep or posterior cervical adenopathy.    Left cervical: No superficial, deep or posterior cervical adenopathy.     Upper Body:     Right upper body: No supraclavicular, axillary or pectoral adenopathy.     Left upper body: No supraclavicular, axillary or pectoral adenopathy.     Lower Body: No right inguinal adenopathy. No left inguinal adenopathy.  Skin:    General: Skin is warm.     Findings: No rash.  Neurological:     Mental Status: She is alert and oriented to person, place, and time.     Cranial Nerves: Cranial nerves are intact. No cranial nerve deficit, dysarthria or facial asymmetry.     Sensory: Sensation is intact.     Motor: Motor function is intact.     Deep Tendon Reflexes: Reflexes are normal and symmetric. Reflexes normal.     Reflex Scores:      Tricep reflexes are 2+ on the right side and 2+ on the left side.      Bicep reflexes are 2+ on the right side and 2+ on the left side.      Brachioradialis reflexes are 2+ on the right side and 2+ on the left side.      Patellar reflexes are 2+ on the right side and 2+ on the left side.      Achilles reflexes are 2+ on the right side and 2+ on the left side. Psychiatric:         Mood and Affect: Mood is not anxious or depressed.     Wt Readings from Last 3 Encounters:  09/09/18 217 lb (98.4 kg)  04/14/18 217 lb 12.8 oz (98.8 kg)  03/15/18 231 lb (104.8 kg)    BP 120/64   Pulse 80   Ht 5\' 2"  (1.575 m)   Wt 217 lb (98.4 kg)   BMI 39.69 kg/m   Assessment and Plan:  1. Breast cancer screening by mammogram Breast exam was done during the physical exam and patient had no mass palpable.  We will proceed with a screening mammogram. - MM 3D SCREEN BREAST BILATERAL; Future  2. Annual physical exam No subjective/objective concerns noted during physical exam.  Review of previous encounter.  Will check lipid panel and CMP.Grace Harris is a 53 y.o. female who presents today for her Complete Annual Exam. She feels well. She reports exercising . She reports she is sleeping well. Immunizations are reviewed and recommendations provided.   Age appropriate screening tests are discussed. Counseling given for risk factor reduction interventions.Health risks of being over weight were discussed and patient was counseled on weight loss options and exercise. - Lipid panel - Comprehensive metabolic panel  3. Encounter for gynecological examination with Papanicolaou smear of cervix Patient had a pelvic exam and Pap smear for evaluation for cervical cancer. - Cytology - PAP  4. Class 2 obesity  due to excess calories without serious comorbidity with body mass index (BMI) of 39.0 to 39.9 in adult Health risks of being over weight were discussed and patient was counseled on weight loss options and exercise.  Multiple options were discussed with patient and patient was provided with a Mediterranean diet.  5. Cervical cancer screening Discussed and Pap was obtained - Cytology - PAP  6. Colon cancer screening DRE and guaiac was negative - POCT occult blood stool

## 2018-09-10 LAB — COMPREHENSIVE METABOLIC PANEL
ALT: 12 IU/L (ref 0–32)
AST: 14 IU/L (ref 0–40)
Albumin/Globulin Ratio: 1.7 (ref 1.2–2.2)
Albumin: 4.3 g/dL (ref 3.8–4.8)
Alkaline Phosphatase: 45 IU/L (ref 39–117)
BUN/Creatinine Ratio: 17 (ref 9–23)
BUN: 13 mg/dL (ref 6–24)
Bilirubin Total: 0.2 mg/dL (ref 0.0–1.2)
CO2: 24 mmol/L (ref 20–29)
Calcium: 9.3 mg/dL (ref 8.7–10.2)
Chloride: 105 mmol/L (ref 96–106)
Creatinine, Ser: 0.76 mg/dL (ref 0.57–1.00)
GFR calc Af Amer: 106 mL/min/{1.73_m2} (ref >59)
GFR calc non Af Amer: 92 mL/min/{1.73_m2} (ref >59)
Globulin, Total: 2.6 g/dL (ref 1.5–4.5)
Glucose: 98 mg/dL (ref 65–99)
Potassium: 4.4 mmol/L (ref 3.5–5.2)
Sodium: 142 mmol/L (ref 134–144)
Total Protein: 6.9 g/dL (ref 6.0–8.5)

## 2018-09-10 LAB — LIPID PANEL
Chol/HDL Ratio: 3.5 ratio (ref 0.0–4.4)
Cholesterol, Total: 214 mg/dL — ABNORMAL HIGH (ref 100–199)
HDL: 62 mg/dL (ref >39)
LDL Calculated: 142 mg/dL — ABNORMAL HIGH (ref 0–99)
Triglycerides: 50 mg/dL (ref 0–149)
VLDL Cholesterol Cal: 10 mg/dL (ref 5–40)

## 2018-09-15 LAB — CYTOLOGY - PAP
Diagnosis: NEGATIVE
HPV: NOT DETECTED

## 2018-09-22 ENCOUNTER — Ambulatory Visit: Payer: BC Managed Care – PPO

## 2018-09-23 ENCOUNTER — Other Ambulatory Visit: Payer: Self-pay

## 2018-09-23 ENCOUNTER — Ambulatory Visit
Admission: RE | Admit: 2018-09-23 | Discharge: 2018-09-23 | Disposition: A | Discharge status: Home / Self Care | Payer: BC Managed Care – PPO | Source: Ambulatory Visit | Attending: Family Medicine | Admitting: Family Medicine

## 2018-09-23 DIAGNOSIS — Z1231 Encounter for screening mammogram for malignant neoplasm of breast: Secondary | ICD-10-CM

## 2018-09-28 ENCOUNTER — Encounter: Payer: Self-pay | Admitting: Family Medicine

## 2018-09-29 ENCOUNTER — Other Ambulatory Visit: Payer: Self-pay

## 2018-09-29 DIAGNOSIS — R21 Rash and other nonspecific skin eruption: Secondary | ICD-10-CM

## 2018-09-29 MED ORDER — CLOBETASOL PROP EMOLLIENT BASE 0.05 % EX CREA: 1.0000 "application " | TOPICAL_CREAM | Freq: Two times a day (BID) | CUTANEOUS | 0 refills | Status: DC

## 2018-09-29 NOTE — Progress Notes (Unsigned)
Sent clobetasol to [facility]

## 2018-10-06 ENCOUNTER — Encounter: Payer: Self-pay | Admitting: Family Medicine

## 2019-10-13 ENCOUNTER — Ambulatory Visit: Payer: BC Managed Care – PPO | Admitting: Family Medicine

## 2019-10-13 ENCOUNTER — Other Ambulatory Visit: Payer: Self-pay

## 2019-10-13 ENCOUNTER — Encounter: Payer: Self-pay | Admitting: Family Medicine

## 2019-10-13 ENCOUNTER — Telehealth: Payer: Self-pay

## 2019-10-13 VITALS — BP 110/76 | HR 60 | Ht 62.0 in | Wt 234.0 lb

## 2019-10-13 DIAGNOSIS — N3090 Cystitis, unspecified without hematuria: Secondary | ICD-10-CM

## 2019-10-13 DIAGNOSIS — Z01818 Encounter for other preprocedural examination: Secondary | ICD-10-CM | POA: Diagnosis not present

## 2019-10-13 DIAGNOSIS — R21 Rash and other nonspecific skin eruption: Secondary | ICD-10-CM

## 2019-10-13 LAB — POCT URINALYSIS DIPSTICK
Bilirubin, UA: NEGATIVE
Blood, UA: NEGATIVE
Glucose, UA: NEGATIVE
Ketones, UA: NEGATIVE
Nitrite, UA: NEGATIVE
Protein, UA: NEGATIVE
Spec Grav, UA: 1.02
Urobilinogen, UA: 0.2 U/dL
pH, UA: 5

## 2019-10-13 MED ORDER — CEPHALEXIN 500 MG PO CAPS: 500.0000 mg | ORAL_CAPSULE | Freq: Three times a day (TID) | ORAL | 0 refills | Status: DC

## 2019-10-13 MED ORDER — CLOBETASOL PROP EMOLLIENT BASE 0.05 % EX CREA: 1.0000 "application " | TOPICAL_CREAM | Freq: Two times a day (BID) | CUTANEOUS | 0 refills | Status: DC

## 2019-10-13 NOTE — Telephone Encounter (Signed)
Called pt to let her know that when she was in the office we got a urine sample. Pt had lueks in her urine and needed antibiotics. Told pt that Dr. Yetta Barre sent in an antibiotic to [facility] in [place]. Pt verbalized understanding.  KP

## 2019-10-13 NOTE — Progress Notes (Signed)
Date:  10/13/2019   Name:  Grace Harris   DOB:  03/17/1965   MRN:  [id number]   Chief Complaint: Pre-op Exam (total R) knee replacement sched for 11/18/19)  Patient is a 54 year old femle who presents for a  Preoperative clearance exam. The patient reports the following problems: none. Health maintenance has been reviewed up to date.   Lab Results  Component Value Date   CREATININE 0.76 09/09/2018   BUN 13 09/09/2018   NA 142 09/09/2018   K 4.4 09/09/2018   CL 105 09/09/2018   CO2 24 09/09/2018   Lab Results  Component Value Date   CHOL 214 (H) 09/09/2018   HDL 62 09/09/2018   LDLCALC 142 (H) 09/09/2018   TRIG 50 09/09/2018   CHOLHDL 3.5 09/09/2018   No results found for: TSH No results found for: HGBA1C Lab Results  Component Value Date   WBC 5.2 07/09/2016   HGB 10.3 (L) 07/09/2016   HCT 34.1 07/09/2016   MCV 82 07/09/2016   PLT 260 07/09/2016   Lab Results  Component Value Date   ALT 12 09/09/2018   AST 14 09/09/2018   ALKPHOS 45 09/09/2018   BILITOT 0.2 09/09/2018     Review of Systems  Constitutional: Negative.  Negative for chills, fatigue, fever and unexpected weight change.  HENT: Negative for congestion, ear discharge, ear pain, rhinorrhea, sinus pressure, sneezing and sore throat.   Eyes: Negative for photophobia, pain, discharge, redness and itching.  Respiratory: Negative for cough, shortness of breath, wheezing and stridor.   Gastrointestinal: Negative for abdominal pain, blood in stool, constipation, diarrhea, nausea and vomiting.  Endocrine: Negative for cold intolerance, heat intolerance, polydipsia, polyphagia and polyuria.  Genitourinary: Negative for dysuria, flank pain, frequency, hematuria, menstrual problem, pelvic pain, urgency, vaginal bleeding and vaginal discharge.  Musculoskeletal: Negative for arthralgias, back pain and myalgias.  Skin: Negative for rash.  Allergic/Immunologic: Negative for environmental allergies and food  allergies.  Neurological: Negative for dizziness, weakness, light-headedness, numbness and headaches.  Hematological: Negative for adenopathy. Does not bruise/bleed easily.  Psychiatric/Behavioral: Negative for dysphoric mood. The patient is not nervous/anxious.     Patient Active Problem List   Diagnosis Date Noted  . Varicose veins of left lower extremity with inflammation 04/14/2018  . Bilateral lower extremity edema 10/07/2017  . History of total left knee replacement (TKR) 10/07/2017    No Known Allergies  Past Surgical History:  Procedure Laterality Date  . CESAREAN SECTION     x 2  . JOINT REPLACEMENT Left 04/2017   @ [facility] Left 05/28/2017   Procedure: CLOSED MANIPULATION KNEE;  Surgeon: Leanor Kail, MD;  Location: [facility] ORS;  Service: Orthopedics;  Laterality: Left;  Marland Kitchen VARICOSE VEIN SURGERY      Social History   Tobacco Use  . Smoking status: Never Smoker  . Smokeless tobacco: Never Used  Vaping Use  . Vaping Use: Never used  Substance Use Topics  . Alcohol use: Yes    Alcohol/week: 0.0 standard drinks    Comment: RARE  . Drug use: No     Medication list has been reviewed and updated.  Current Meds  Medication Sig  . Clobetasol Prop Emollient Base (CLOBETASOL PROPIONATE E) 0.05 % emollient cream Apply 1 application topically 2 (two) times daily.  . naproxen (NAPROSYN) 500 MG tablet Take by mouth.    PHQ 2/9 Scores 10/13/2019 09/09/2018 04/09/2017 07/05/2015  PHQ -  2 Score 0 0 0 0  PHQ- 9 Score 0 1 0 -    GAD 7 : Generalized Anxiety Score 10/13/2019  Nervous, Anxious, on Edge 0  Control/stop worrying 0  Worry too much - different things 0  Trouble relaxing 0  Restless 0  Easily annoyed or irritable 0  Afraid - awful might happen 0  Total GAD 7 Score 0    BP Readings from Last 3 Encounters:  10/13/19 110/76  09/09/18 120/64  04/14/18 123/80    Physical Exam Vitals and nursing note reviewed.    Constitutional:      Appearance: She is well-developed.  HENT:     Head: Normocephalic.     Right Ear: Tympanic membrane, ear canal and external ear normal.     Left Ear: Tympanic membrane, ear canal and external ear normal.  Eyes:     General: Lids are everted, no foreign bodies appreciated. No scleral icterus.       Left eye: No foreign body or hordeolum.     Conjunctiva/sclera: Conjunctivae normal.     Right eye: Right conjunctiva is not injected.     Left eye: Left conjunctiva is not injected.     Pupils: Pupils are equal, round, and reactive to light.  Neck:     Thyroid: No thyromegaly.     Vascular: No JVD.     Trachea: No tracheal deviation.  Cardiovascular:     Rate and Rhythm: Normal rate and regular rhythm.     Pulses: Normal pulses.     Heart sounds: Normal heart sounds, S1 normal and S2 normal. No murmur heard.  No systolic murmur is present.  No diastolic murmur is present.  No friction rub. No gallop. No S3 or S4 sounds.   Pulmonary:     Effort: Pulmonary effort is normal. No respiratory distress.     Breath sounds: Normal breath sounds. No decreased breath sounds, wheezing, rhonchi or rales.  Abdominal:     General: Bowel sounds are normal.     Palpations: Abdomen is soft. There is no mass.     Tenderness: There is no abdominal tenderness. There is no guarding or rebound.  Musculoskeletal:        General: No tenderness. Normal range of motion.     Cervical back: Normal range of motion and neck supple.     Right lower leg: No edema.     Left lower leg: No edema.  Lymphadenopathy:     Cervical: No cervical adenopathy.  Skin:    General: Skin is warm.     Findings: No rash.  Neurological:     Mental Status: She is alert and oriented to person, place, and time.     Cranial Nerves: No cranial nerve deficit.     Deep Tendon Reflexes: Reflexes normal.  Psychiatric:        Mood and Affect: Mood is not anxious or depressed.     Wt Readings from Last 3  Encounters:  10/13/19 234 lb (106.1 kg)  09/09/18 217 lb (98.4 kg)  04/14/18 217 lb 12.8 oz (98.8 kg)    BP 110/76   Pulse 60   Ht '5\' 2"'  (1.575 m)   Wt 234 lb (106.1 kg)   BMI 42.80 kg/m   Assessment and Plan:  1. Preop examination No subjective objective concerns noted during exam and history.  Patient has no medical contraindications to surgical procedure.  Will obtain necessary labs CBC PT/INR BMP PT as well as urinalysis.  Patient's chart was reviewed for previous encounters, most recent labs, most recent imaging, as well as care everywhere.  EKG was done for cardiovascular evaluation with the following results.I have reviewed EKG which shows normal sinus rhythm.  Acceptable intervals.  No LVH criteria met by voltage.  No ischemic changes noted.. Comparison to previous EKG dated no EKG noted for comparison..- CBC w/Diff/Platelet. - INR/PT - Basic Metabolic Panel (BMET) - PTT - POCT urinalysis dipstick - Urine Culture - EKG 12-Lead  2. Rash at application site Rash noted and patient was refilled on her clobetasol to apply twice a day. - Clobetasol Prop Emollient Base (CLOBETASOL PROPIONATE E) 0.05 % emollient cream; Apply 1 application topically 2 (two) times daily.  Dispense: 30 g; Refill: 0  Patient is cleared for surgery and there is no medical contraindication for upcoming knee surgery.                                                                                                                                            Patient returns with a urinalysis and upon evaluation is noted to have 2+ leuks consistent with an early urinary tract infection

## 2019-10-14 ENCOUNTER — Other Ambulatory Visit: Payer: Self-pay | Admitting: Family Medicine

## 2019-10-14 LAB — BASIC METABOLIC PANEL
BUN/Creatinine Ratio: 27 — ABNORMAL HIGH (ref 9–23)
BUN: 22 mg/dL (ref 6–24)
CO2: 23 mmol/L (ref 20–29)
Calcium: 9.7 mg/dL (ref 8.7–10.2)
Chloride: 104 mmol/L (ref 96–106)
Creatinine, Ser: 0.81 mg/dL (ref 0.57–1.00)
GFR calc Af Amer: 97 mL/min/{1.73_m2} (ref >59)
GFR calc non Af Amer: 84 mL/min/{1.73_m2} (ref >59)
Glucose: 101 mg/dL — ABNORMAL HIGH (ref 65–99)
Potassium: 4.8 mmol/L (ref 3.5–5.2)
Sodium: 140 mmol/L (ref 134–144)

## 2019-10-14 LAB — CBC WITH DIFFERENTIAL/PLATELET
Basophils Absolute: 0.1 10*3/uL (ref 0.0–0.2)
Basos: 2 %
EOS (ABSOLUTE): 0.4 10*3/uL (ref 0.0–0.4)
Eos: 9 %
Hematocrit: 39.5 % (ref 34.0–46.6)
Hemoglobin: 12.6 g/dL (ref 11.1–15.9)
Immature Grans (Abs): 0 10*3/uL (ref 0.0–0.1)
Immature Granulocytes: 0 %
Lymphocytes Absolute: 1 10*3/uL (ref 0.7–3.1)
Lymphs: 22 %
MCH: 26.6 pg (ref 26.6–33.0)
MCHC: 31.9 g/dL (ref 31.5–35.7)
MCV: 83 fL (ref 79–97)
Monocytes Absolute: 0.3 10*3/uL (ref 0.1–0.9)
Monocytes: 7 %
Neutrophils Absolute: 2.8 10*3/uL (ref 1.4–7.0)
Neutrophils: 60 %
Platelets: 218 10*3/uL (ref 150–450)
RBC: 4.74 x10E6/uL (ref 3.77–5.28)
RDW: 15.7 % — ABNORMAL HIGH (ref 11.7–15.4)
WBC: 4.6 10*3/uL (ref 3.4–10.8)

## 2019-10-14 LAB — PROTIME-INR
INR: 1 (ref 0.9–1.2)
Prothrombin Time: 10.3 s (ref 9.1–12.0)

## 2019-10-14 LAB — APTT: aPTT: 27 s (ref 24–33)

## 2019-10-16 LAB — URINE CULTURE: Organism ID, Bacteria: NO GROWTH

## 2019-11-10 LAB — COMPREHENSIVE METABOLIC PANEL: Calcium: 8.7 (ref 8.7–10.7)

## 2019-11-10 LAB — CBC AND DIFFERENTIAL
HCT: 36 (ref 36–46)
Hemoglobin: 11.5 — AB (ref 12.0–16.0)
Platelets: 203 (ref 150–399)
WBC: 5.7

## 2019-11-10 LAB — BASIC METABOLIC PANEL
BUN: 17 (ref 4–21)
Chloride: 106 (ref 99–108)
Creatinine: 0.9 (ref 0.5–1.1)
Glucose: 101
Potassium: 4.1 (ref 3.4–5.3)
Sodium: 139 (ref 137–147)

## 2019-11-10 LAB — POCT INR: INR: 11.7 — AB (ref 0.9–1.1)

## 2019-11-10 LAB — CBC: RBC: 4.35 (ref 3.87–5.11)

## 2019-11-10 LAB — HEMOGLOBIN A1C: Hemoglobin A1C: 5.9

## 2019-11-11 ENCOUNTER — Telehealth: Payer: Self-pay | Admitting: Family Medicine

## 2019-11-11 ENCOUNTER — Other Ambulatory Visit: Payer: Self-pay

## 2019-11-11 DIAGNOSIS — N3090 Cystitis, unspecified without hematuria: Secondary | ICD-10-CM

## 2019-11-11 MED ORDER — NITROFURANTOIN MONOHYD MACRO 100 MG PO CAPS: 100.0000 mg | ORAL_CAPSULE | Freq: Two times a day (BID) | ORAL | 0 refills | Status: DC

## 2019-11-11 NOTE — Telephone Encounter (Signed)
I am going to call her, if it is her A1C- it is acceptable- that is all I see

## 2019-11-11 NOTE — Progress Notes (Unsigned)
Sent in Macrobid due to UTI on [facility]

## 2019-11-11 NOTE — Telephone Encounter (Unsigned)
Copied from CRM [id number]. Topic: General - Other >> Nov 11, 2019  9:03 AM Dalphine Handing A wrote: Roland Earl is faxing over information this morning for Dr. Yetta Barre in regards to patients labs and upcoming procedure. Please advise

## 2019-12-07 ENCOUNTER — Other Ambulatory Visit: Payer: Self-pay

## 2019-12-07 ENCOUNTER — Ambulatory Visit: Payer: BC Managed Care – PPO | Attending: Orthopaedic Surgery

## 2019-12-07 DIAGNOSIS — Z96651 Presence of right artificial knee joint: Secondary | ICD-10-CM

## 2019-12-07 DIAGNOSIS — M6281 Muscle weakness (generalized): Secondary | ICD-10-CM

## 2019-12-07 DIAGNOSIS — M25661 Stiffness of right knee, not elsewhere classified: Secondary | ICD-10-CM | POA: Diagnosis present

## 2019-12-07 DIAGNOSIS — R2689 Other abnormalities of gait and mobility: Secondary | ICD-10-CM

## 2019-12-07 NOTE — Therapy (Signed)
[facility] [address] [place], [place], [address] Phone: [phone]   Fax:  [phone]  Physical Therapy Evaluation  Patient Details  Name: Grace Harris MRN: [id number] Date of Birth: May 10, 1965 Referring Provider (PT): Lowella Dell, MD   Encounter Date: 12/07/2019   PT End of Session - 12/07/19 0946    Visit Number 1    Number of Visits 9    Date for PT Re-Evaluation 01/04/20    PT Start Time 0901    PT Stop Time 0945    PT Time Calculation (min) 44 min    Activity Tolerance Patient tolerated treatment well    Behavior During Therapy [facility] for tasks assessed/performed           Past Medical History:  Diagnosis Date  . Anemia   . Arthritis     Past Surgical History:  Procedure Laterality Date  . CESAREAN SECTION     x 2  . JOINT REPLACEMENT Left 04/2017   @ [facility] [place]   . KNEE CLOSED REDUCTION Left 05/28/2017   Procedure: CLOSED MANIPULATION KNEE;  Surgeon: Erin Sons, MD;  Location: [facility] ORS;  Service: Orthopedics;  Laterality: Left;  Marland Kitchen VARICOSE VEIN SURGERY      There were no vitals filed for this visit.    Subjective Assessment - 12/07/19 0907    Subjective Pt. is a 54 y.o. female s/p R TKA 3 weeks ago. Pt. states that pain is worse at night with shooting and aching pains, and during the day it occassionally feels like shooting pains. Pt states pain is 3/10 during the day, 6/10 at night. Pt. states that she is using her ice pack relatively well, but has not done it for the past few nights. Pt. states that she still feels some swelling in the knee, she is not wearing the compression stocking, but is wearing compression socks. Pt. states that she feels like she's moving slow, but is getting around. Pt. states she has 3 STE to enter the house, none inside. Pt. saw MD last Friday, pt reports he is happy with progress.    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    Limitations Walking    How long can you stand comfortably? 15 minutes    How long can you walk comfortably? 10 mins    Patient Stated Goals Pt wants to get the most amount of motion back.    Currently in Pain? Yes    Pain Score 2     Pain Location Knee    Pain Orientation Right    Pain Descriptors / Indicators Aching             BJECTIVE  NEUROLOGICAL Mental Status Patient is oriented to person, place and time.  Recent memory is intact.  Remote memory is intact.  Attention span and concentration are intact.  Expressive speech is intact.  Patient's fund of knowledge is within normal limits for educational level.   MUSCULOSKELETAL: Tremor: Absent Bulk: Normal Tone: Normal, no spasticity, rigidity, or clonus No trophic changes noted to lower extremities. No ecchymosis, erythema, or edema noted around knee. No gross knee deformity noted  Posture No gross abnormalities noted in standing or seated posture  Gait Antalgic gait on R, ambulates with quad cane.   Palpation Pain with palpation on R knee along joint line  Scar Scar appeared normal  for time since surgery. Scabbing noted. No redness, oozing, bleeding, or excessive swelling noted.    ROM  LLE RLE  Knee Extension (0) 0 Lacking 1-2 deg   Knee Flexion (130) 105 PROM (supine): 73    STRENGTH (MMT)  LLE RLE  Hip Flexion 5/5 4+/5  Hip Abduction  5/5 5/5  Hip Adduction  5/5 5/5  Hip ER  5/5 3+/5*  Hip IR  5/5 4/5  Knee Extension 5/5 4/5*  Knee Flexion 5/5 4+/5  Dorsiflexion  5/5 5/5  Plantarflexion (seated) 5/5 5/5   Circumferential measurement: taken mid patella. R: 58 cm, L: 57 cm  [facility] PT Assessment - 12/07/19 0001      Assessment   Medical Diagnosis S/P R TKA    Referring Provider (PT) Lowella Dell, MD    Onset Date/Surgical Date 11/16/19    Next MD Visit 4-6 weeks    Prior Therapy yes      Prior Function   Level of Independence  Independent            Objective measurements completed on examination: See above findings.        PT Long Term Goals - 12/07/19 1344      PT LONG TERM GOAL #1   Title Pt. will improve R knee flexion AROM/PROM to at least 100 degrees to improve functional mobility.    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    Baseline 10/5: pt can only stand for about 15 mins before needing a seated rest break    Time 4    Period Weeks    Status New    Target Date 01/04/20      PT LONG TERM GOAL #3   Title Pt. will increase R knee flex and ext strength by at least half a muscle grade to improve functional mobility.    Baseline 10/5: R knee ext: 4/5, R knee flex: 4+/5    Time 4    Period Weeks    Status New    Target Date 01/04/20      PT LONG TERM GOAL #4   Title Pt. will be able to ambulate without AD and at most mild evidence of antalgic gait on the R to improve functional mobility.    Baseline 10/5: pt ambulates with moderate antalgic gait on R LE with quad cane    Time 4    Period Weeks    Status New    Target Date 01/04/20                  Plan - 12/07/19 0947    Clinical Impression Statement Pt. presents to therapy 3 weeks s/p R knee replacement. Pt. reports she has finished home health about a week ago. Pt.'s ambulates in the clinic with R antalgic gait and quad cane in L hand. PT adjusted quad cane height to be one hole lower. PT assessed scar, there is scabbing noted but no excessive redness, swelling, oozing, or bleeding was noted. Pt. educated on acquiring vitamin E based lotion to begin scar work. Pt. strength assessed, pt has 5/5 strength on L LE, R strength as follows: hip flex: 4+/5, hip abd/add: 5/5, hip  ER: 3+/5, hip IR: 4/5, knee ext: 4/5, hip flex: 4+/5,  dorsiflex/plantarflex: 5/5. Pt. has slight swelling in R knee, circumferential measurments taken at mid patella: R: 58 cm, L: 57cm. Pt. demonstrates following ROM in knee: L knee ext: 0 deg, L knee flex: 105 deg. R knee flex: lacking 1-2 deg ext, R knee ext: 73 deg. Pt. will benefit from skilled PT to improve knee flex and ext to improve gait and ability to return to work.    Examination-Activity Limitations Bed Mobility;Lift;Squat;Stairs;Stand    Examination-Participation Restrictions Occupation    Stability/Clinical Decision Making Stable/Uncomplicated    Clinical Decision Making Low    Rehab Potential Good    PT Frequency 2x / week    PT Duration 4 weeks    PT Treatment/Interventions ADLs/Self Care Home Management;Cryotherapy;Therapeutic activities;Therapeutic exercise;Functional mobility training;Gait training;Balance training;Neuromuscular re-education;Patient/family education;Manual techniques;Scar mobilization;Passive range of motion;Stair training    PT Next Visit Plan FOTO           Patient will benefit from skilled therapeutic intervention in order to improve the following deficits and impairments:  Abnormal gait, Decreased activity tolerance, Decreased endurance, Decreased strength, Difficulty walking, Impaired flexibility, Increased edema, Hypomobility, Increased fascial restricitons, Improper body mechanics, Postural dysfunction, Pain, Decreased mobility, Decreased range of motion  Visit Diagnosis: Status post right knee replacement  Decreased range of motion (ROM) of right knee  Muscle weakness (generalized)  Other abnormalities of gait and mobility     Problem List Patient Active Problem List   Diagnosis Date Noted  . Varicose veins of left lower extremity with inflammation 04/14/2018  . Bilateral lower extremity edema 10/07/2017  . History of total left knee replacement (TKR) 10/07/2017    Sharyn Creamer, SPT 12/07/2019, 2:36 PM  [facility] [address] [place], [place], [address] Phone: [phone]   Fax:  [phone]  Name: CYNTHIS PURINGTON MRN: [id number] Date of Birth: 26-Jan-1966

## 2019-12-09 ENCOUNTER — Other Ambulatory Visit: Payer: Self-pay

## 2019-12-09 ENCOUNTER — Encounter: Payer: Self-pay | Admitting: Physical Therapy

## 2019-12-09 ENCOUNTER — Ambulatory Visit: Payer: BC Managed Care – PPO | Admitting: Physical Therapy

## 2019-12-09 DIAGNOSIS — M25661 Stiffness of right knee, not elsewhere classified: Secondary | ICD-10-CM

## 2019-12-09 DIAGNOSIS — M6281 Muscle weakness (generalized): Secondary | ICD-10-CM

## 2019-12-09 DIAGNOSIS — Z96651 Presence of right artificial knee joint: Secondary | ICD-10-CM | POA: Diagnosis not present

## 2019-12-09 DIAGNOSIS — R2689 Other abnormalities of gait and mobility: Secondary | ICD-10-CM

## 2019-12-09 NOTE — Therapy (Signed)
[facility] [address] [place], [place], [address] Phone: [phone]   Fax:  [phone]  Physical Therapy Treatment  Patient Details  Name: Grace Harris MRN: [id number] Date of Birth: 1966-02-11 Referring Provider (PT): Lowella Dell, MD   Encounter Date: 12/09/2019   PT End of Session - 12/09/19 0823    Visit Number 2    Number of Visits 9    Date for PT Re-Evaluation 01/04/20    PT Start Time 0819    PT Stop Time 0917    PT Time Calculation (min) 58 min    Activity Tolerance Patient tolerated treatment well    Behavior During Therapy [facility] for tasks assessed/performed           Past Medical History:  Diagnosis Date  . Anemia   . Arthritis     Past Surgical History:  Procedure Laterality Date  . CESAREAN SECTION     x 2  . JOINT REPLACEMENT Left 04/2017   @ [facility] [place]  . KNEE CLOSED REDUCTION Left 05/28/2017   Procedure: CLOSED MANIPULATION KNEE;  Surgeon: Erin Sons, MD;  Location: [facility] ORS;  Service: Orthopedics;  Laterality: Left;  Marland Kitchen VARICOSE VEIN SURGERY      There were no vitals filed for this visit.   Subjective Assessment - 12/09/19 0824    Subjective Pt. reports that pain has been about the same. Pt. reports that she got vitamin E lotion and has been applying it. Pt. states scar is doing well with no oozing or bleeding.    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    Limitations Walking    How long can you stand comfortably? 15 minutes    How long can you walk comfortably? 10 mins    Patient Stated Goals Pt wants to get the most amount of motion back.    Currently in Pain? No/denies           FOTO:  Initial 52/  Goal 70    There Ex:  Nustep to improve knee flexion: Seat position 8, no resistance. B UE/LE  Seated contract/relax R knee: towel slides with overpressure provided from  therapist. Pt able to achieve about 90 deg flex in sitting  Walking in // bars with focus on R knee flex, emphasis on heel strike, even step length, and even stance time bilat.   Reassessed HEP   Manual tx.:  Supine/ seated R knee flexion stretches (static holds)- 7x.  Supine R hamstring (proximal/ distal stretches)- 3x 30 sec. Each.  Supine R hip/knee flexion contract-relax (3x)  Discussed R scar massage/ incision healing.         PT Long Term Goals - 12/07/19 1344      PT LONG TERM GOAL #1   Title Pt. will improve R knee flexion AROM/PROM to at least 100 degrees to improve functional mobility.    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    Baseline 10/5: pt can only stand for about 15 mins before needing a seated rest break    Time 4  Period Weeks    Status New    Target Date 01/04/20      PT LONG TERM GOAL #3   Title Pt. will increase R knee flex and ext strength by at least half a muscle grade to improve functional mobility.    Baseline 10/5: R knee ext: 4/5, R knee flex: 4+/5    Time 4    Period Weeks    Status New    Target Date 01/04/20      PT LONG TERM GOAL #4   Title Pt. will be able to ambulate without AD and at most mild evidence of antalgic gait on the R to improve functional mobility.    Baseline 10/5: pt ambulates with moderate antalgic gait on R LE with quad cane    Time 4    Period Weeks    Status New    Target Date 01/04/20              Plan - 12/09/19 1025    Clinical Impression Statement Pt. returns to therapy with quad cane. Pt. states pain has been about the same since last visit. Pt. has begun scar massage with vitamin E based lotion. Pt.'s gait was assessed and corrected to improve proper mechanics and decrease compensations, pt  required mild-mod verbal cueing to improve equal stance time and step length bilat.  Pt. able to ambulate with no AD by end of session and left clinic without using cane. In sitting and supine, pt. able to achieve 92 deg R knee flex PROM (pain limited).  Pt. will continue to benefit from skilled PT to improve knee flex to improve gait and ability to return to work.    Examination-Activity Limitations Bed Mobility;Lift;Squat;Stairs;Stand    Examination-Participation Restrictions Occupation    Stability/Clinical Decision Making Stable/Uncomplicated    Clinical Decision Making Low    Rehab Potential Good    PT Frequency 2x / week    PT Duration 4 weeks    PT Treatment/Interventions ADLs/Self Care Home Management;Cryotherapy;Therapeutic activities;Therapeutic exercise;Functional mobility training;Gait training;Balance training;Neuromuscular re-education;Patient/family education;Manual techniques;Scar mobilization;Passive range of motion;Stair training    PT Next Visit Plan Reassess scar/incision.  Progress knee flexion    PT Home Exercise Plan Reviewed HEP    Consulted and Agree with Plan of Care Patient           Patient will benefit from skilled therapeutic intervention in order to improve the following deficits and impairments:  Abnormal gait, Decreased activity tolerance, Decreased endurance, Decreased strength, Difficulty walking, Impaired flexibility, Increased edema, Hypomobility, Increased fascial restricitons, Improper body mechanics, Postural dysfunction, Pain, Decreased mobility, Decreased range of motion  Visit Diagnosis: Status post right knee replacement  Decreased range of motion (ROM) of right knee  Muscle weakness (generalized)  Other abnormalities of gait and mobility     Problem List Patient Active Problem List   Diagnosis Date Noted  . Varicose veins of left lower extremity with inflammation 04/14/2018  . Bilateral lower extremity edema 10/07/2017  . History of  total left knee replacement (TKR) 10/07/2017   Cammie Mcgee, PT, DPT # [id number] 12/09/2019, 3:56 PM  [facility] [address] [place], [place], [address] Phone: [phone]   Fax:  [phone]  Name: Grace Harris MRN: [id number] Date of Birth: Aug 24, 1965

## 2019-12-14 ENCOUNTER — Encounter: Payer: Self-pay | Admitting: Physical Therapy

## 2019-12-14 ENCOUNTER — Other Ambulatory Visit: Payer: Self-pay

## 2019-12-14 ENCOUNTER — Telehealth: Payer: Self-pay

## 2019-12-14 ENCOUNTER — Ambulatory Visit: Payer: BC Managed Care – PPO | Admitting: Physical Therapy

## 2019-12-14 DIAGNOSIS — Z96651 Presence of right artificial knee joint: Secondary | ICD-10-CM | POA: Diagnosis not present

## 2019-12-14 DIAGNOSIS — M25661 Stiffness of right knee, not elsewhere classified: Secondary | ICD-10-CM

## 2019-12-14 DIAGNOSIS — M6281 Muscle weakness (generalized): Secondary | ICD-10-CM

## 2019-12-14 DIAGNOSIS — R2689 Other abnormalities of gait and mobility: Secondary | ICD-10-CM

## 2019-12-14 NOTE — Patient Instructions (Signed)
Access Code: [email or web address] URL: [email or web address] Date: 12/14/2019 Prepared by: Dorene Grebe  Exercises Supine Heel Slide with Strap - 2 x daily - 7 x weekly - 1 sets - 10 reps - 20 seconds hold Supine Quad Set - 2 x daily - 7 x weekly - 2 sets - 10 reps Proper Sit to Stand Technique - 2 x daily - 7 x weekly - 2 sets - 10 reps Small Range Straight Leg Raise - 2 x daily - 7 x weekly - 2 sets - 10 reps

## 2019-12-14 NOTE — Telephone Encounter (Signed)
Coming in at 10:00 tomorrow

## 2019-12-14 NOTE — Telephone Encounter (Signed)
Copied from CRM [id number]. Topic: Quick Communication - See Telephone Encounter >> Dec 14, 2019  9:35 AM Aretta Nip wrote: CRM for notification. See Telephone encounter for: 12/14/19. Pt wants Delice Bison to call, states does not know me and would just like to talk Delice Bison about an issue.

## 2019-12-14 NOTE — Therapy (Signed)
[facility] [address] [place], [place], [address] Phone: [phone]   Fax:  [phone]  Physical Therapy Treatment  Patient Details  Name: Grace Harris MRN: [id number] Date of Birth: 30-Jun-1965 Referring Provider (PT): Lowella Dell, MD   Encounter Date: 12/14/2019   PT End of Session - 12/14/19 0826    Visit Number 3    Number of Visits 9    Date for PT Re-Evaluation 01/04/20    PT Start Time 0820    PT Stop Time 0914    PT Time Calculation (min) 54 min    Activity Tolerance Patient tolerated treatment well    Behavior During Therapy [facility] for tasks assessed/performed           Past Medical History:  Diagnosis Date   Anemia    Arthritis     Past Surgical History:  Procedure Laterality Date   CESAREAN SECTION     x 2   JOINT REPLACEMENT Left 04/2017   @ [facility] [place]   KNEE CLOSED REDUCTION Left 05/28/2017   Procedure: CLOSED MANIPULATION KNEE;  Surgeon: Erin Sons, MD;  Location: [facility] ORS;  Service: Orthopedics;  Laterality: Left;   VARICOSE VEIN SURGERY      There were no vitals filed for this visit.   Subjective Assessment - 12/14/19 0824    Subjective Pt. reports that she was in pain after last session, 6/10. Pt. states she was also doing things at home, but it felt a little tighter and a bit more swollen. Today, pt states R knee is 2/10.    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    Limitations Walking    How long can you stand comfortably? 15 minutes    How long can you walk comfortably? 10 mins    Patient Stated Goals Pt wants to get the most amount of motion back.    Currently in Pain? Yes    Pain Score 2     Pain Location Knee    Pain Orientation Right    Pain Descriptors / Indicators Aching             There Ex:   Nustep to improve knee flexion: L1 Initial seat position 8,  able to progress to seat position 7. Cueing for decreasing hip compensation. . B UE/LE. 10 mins  Walking in hallway to metronome: initially set to 65bpm, progressed to 75bpm. Pt able to walk  // bars walking with ab contraction: 2x, pt demonstrated improved ability to control trunk sway during walking, continued to maintain cadence of steps and improved stance time.   // bar hurdles: 5x. Three 6" hurdles, pt instructed to lead with R leg over hurdles. Pt used bilat UE support on bars.   Total gym: 10x, pt able to achieve 85 deg flex. Pt required verbal cueing ot continue to sink into flexion  STS practice: 5x, cueing for improving even weight on both LE to complete activity.    Manual tx.:  Seated contract relax R knee on blue mat: pt elevated with L foot on foot stool, R foot off ground. Pt instructed to flex and ext knee against therapist pressure, then PT pushed flexion. 5x, 10 sec holds flex.   Supine PROM knee flex: pt on table, PT provided PROM to R knee. Pt able to achieve approx 90 deg flex, significant discomfort and  tightness felt. Pt required distraction to maintain flex.         PT Education - 12/14/19 0903    Education Details Reviewed scar massage/ See HEP               PT Long Term Goals - 12/07/19 1344      PT LONG TERM GOAL #1   Title Pt. will improve R knee flexion AROM/PROM to at least 100 degrees to improve functional mobility.    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    Baseline 10/5: pt can only stand for about 15 mins before needing a seated rest break    Time 4    Period Weeks    Status New    Target Date 01/04/20      PT LONG TERM GOAL #3   Title Pt. will increase R knee flex and ext strength by at least half a muscle grade to improve  functional mobility.    Baseline 10/5: R knee ext: 4/5, R knee flex: 4+/5    Time 4    Period Weeks    Status New    Target Date 01/04/20      PT LONG TERM GOAL #4   Title Pt. will be able to ambulate without AD and at most mild evidence of antalgic gait on the R to improve functional mobility.    Baseline 10/5: pt ambulates with moderate antalgic gait on R LE with quad cane    Time 4    Period Weeks    Status New    Target Date 01/04/20                 Plan - 12/14/19 0916    Clinical Impression Statement Pt. returns to therapy with no AD today. Pt. continues to demonstrate antalgic gait on R LE, able to improve with metronome set to 75bpm. Pt. given ice pack to take home, instructed on requiring barrier between ice pack and skin. Pt. able to complete approx 85 deg R knee AROM flex on Total Gym. PT completed PROM R knee flex in supine, pt able to achieve about 90 deg in this position. Pt. given new HEP. Pt. will continue to benefit from skilled PT to improve R knee flex to improve gait and ability to return to work.    Examination-Activity Limitations Bed Mobility;Lift;Squat;Stairs;Stand    Examination-Participation Restrictions Occupation    Stability/Clinical Decision Making Stable/Uncomplicated    Clinical Decision Making Low    Rehab Potential Good    PT Frequency 2x / week    PT Duration 4 weeks    PT Treatment/Interventions ADLs/Self Care Home Management;Cryotherapy;Therapeutic activities;Therapeutic exercise;Functional mobility training;Gait training;Balance training;Neuromuscular re-education;Patient/family education;Manual techniques;Scar mobilization;Passive range of motion;Stair training    PT Next Visit Plan Progress knee flexion    PT Home Exercise Plan [email or web address]    Consulted and Agree with Plan of Care Patient           Patient will benefit from skilled therapeutic intervention in order to improve the following deficits and impairments:  Abnormal gait,  Decreased activity tolerance, Decreased endurance, Decreased strength, Difficulty walking, Impaired flexibility, Increased edema, Hypomobility, Increased fascial restricitons, Improper body mechanics,  Postural dysfunction, Pain, Decreased mobility, Decreased range of motion  Visit Diagnosis: Status post right knee replacement  Decreased range of motion (ROM) of right knee  Muscle weakness (generalized)  Other abnormalities of gait and mobility     Problem List Patient Active Problem List   Diagnosis Date Noted   Varicose veins of left lower extremity with inflammation 04/14/2018   Bilateral lower extremity edema 10/07/2017   History of total left knee replacement (TKR) 10/07/2017   Cammie Mcgee, PT, DPT # [id number] Sharyn Creamer, SPT 12/14/2019, 9:46 AM  [place] [address] [place], [place], [address] Phone: [phone]   Fax:  [phone]  Name: Grace Harris MRN: [id number] Date of Birth: [birth year]

## 2019-12-15 ENCOUNTER — Encounter: Payer: Self-pay | Admitting: Family Medicine

## 2019-12-15 ENCOUNTER — Ambulatory Visit (INDEPENDENT_AMBULATORY_CARE_PROVIDER_SITE_OTHER): Payer: BC Managed Care – PPO | Admitting: Family Medicine

## 2019-12-15 VITALS — BP 130/80 | HR 80 | Ht 62.0 in | Wt 234.0 lb

## 2019-12-15 DIAGNOSIS — N760 Acute vaginitis: Secondary | ICD-10-CM

## 2019-12-15 DIAGNOSIS — N3090 Cystitis, unspecified without hematuria: Secondary | ICD-10-CM

## 2019-12-15 DIAGNOSIS — B9689 Other specified bacterial agents as the cause of diseases classified elsewhere: Secondary | ICD-10-CM | POA: Diagnosis not present

## 2019-12-15 DIAGNOSIS — R8290 Unspecified abnormal findings in urine: Secondary | ICD-10-CM

## 2019-12-15 LAB — POCT URINALYSIS DIPSTICK
Bilirubin, UA: NEGATIVE
Blood, UA: NEGATIVE
Glucose, UA: NEGATIVE
Ketones, UA: NEGATIVE
Nitrite, UA: NEGATIVE
Protein, UA: NEGATIVE
Spec Grav, UA: 1.01 (ref 1.010–1.025)
Urobilinogen, UA: 0.2 E.U./dL
pH, UA: 5 (ref 5.0–8.0)

## 2019-12-15 LAB — POCT WET PREP WITH KOH
KOH Prep POC: NEGATIVE
Trichomonas, UA: NEGATIVE

## 2019-12-15 MED ORDER — METRONIDAZOLE 500 MG PO TABS: 500.0000 mg | ORAL_TABLET | Freq: Two times a day (BID) | ORAL | 0 refills | Status: DC

## 2019-12-15 NOTE — Patient Instructions (Signed)
Bacterial Vaginosis  Bacterial vaginosis is a vaginal infection that occurs when the normal balance of bacteria in the vagina is disrupted. It results from an overgrowth of certain bacteria. This is the most common vaginal infection among women ages 15-44. Because bacterial vaginosis increases your risk for STIs (sexually transmitted infections), getting treated can help reduce your risk for chlamydia, gonorrhea, herpes, and HIV (human immunodeficiency virus). Treatment is also important for preventing complications in pregnant women, because this condition can cause an early (premature) delivery. What are the causes? This condition is caused by an increase in harmful bacteria that are normally present in small amounts in the vagina. However, the reason that the condition develops is not fully understood. What increases the risk? The following factors may make you more likely to develop this condition:  Having a new sexual partner or multiple sexual partners.  Having unprotected sex.  Douching.  Having an intrauterine device (IUD).  Smoking.  Drug and alcohol abuse.  Taking certain antibiotic medicines.  Being pregnant. You cannot get bacterial vaginosis from toilet seats, bedding, swimming pools, or contact with objects around you. What are the signs or symptoms? Symptoms of this condition include:  Grey or white vaginal discharge. The discharge can also be watery or foamy.  A fish-like odor with discharge, especially after sexual intercourse or during menstruation.  Itching in and around the vagina.  Burning or pain with urination. Some women with bacterial vaginosis have no signs or symptoms. How is this diagnosed? This condition is diagnosed based on:  Your medical history.  A physical exam of the vagina.  Testing a sample of vaginal fluid under a microscope to look for a large amount of bad bacteria or abnormal cells. Your health care provider may use a cotton swab or  a small wooden spatula to collect the sample. How is this treated? This condition is treated with antibiotics. These may be given as a pill, a vaginal cream, or a medicine that is put into the vagina (suppository). If the condition comes back after treatment, a second round of antibiotics may be needed. Follow these instructions at home: Medicines  Take over-the-counter and prescription medicines only as told by your health care provider.  Take or use your antibiotic as told by your health care provider. Do not stop taking or using the antibiotic even if you start to feel better. General instructions  If you have a female sexual partner, tell her that you have a vaginal infection. She should see her health care provider and be treated if she has symptoms. If you have a female sexual partner, he does not need treatment.  During treatment: ? Avoid sexual activity until you finish treatment. ? Do not douche. ? Avoid alcohol as directed by your health care provider. ? Avoid breastfeeding as directed by your health care provider.  Drink enough water and fluids to keep your urine clear or pale yellow.  Keep the area around your vagina and rectum clean. ? Wash the area daily with warm water. ? Wipe yourself from front to back after using the toilet.  Keep all follow-up visits as told by your health care provider. This is important. How is this prevented?  Do not douche.  Wash the outside of your vagina with warm water only.  Use protection when having sex. This includes latex condoms and dental dams.  Limit how many sexual partners you have. To help prevent bacterial vaginosis, it is best to have sex with just one partner (  monogamous).  Make sure you and your sexual partner are tested for STIs.  Wear cotton or cotton-lined underwear.  Avoid wearing tight pants and pantyhose, especially during summer.  Limit the amount of alcohol that you drink.  Do not use any products that contain  nicotine or tobacco, such as cigarettes and e-cigarettes. If you need help quitting, ask your health care provider.  Do not use illegal drugs. Where to find more information  Centers for Disease Control and Prevention: www.cdc.gov/std  American Sexual Health Association (ASHA): www.ashastd.org  U.S. Department of Health and Human Services, Office on Women's Health: www.womenshealth.gov/ or https://www.womenshealth.gov/a-z-topics/bacterial-vaginosis Contact a health care provider if:  Your symptoms do not improve, even after treatment.  You have more discharge or pain when urinating.  You have a fever.  You have pain in your abdomen.  You have pain during sex.  You have vaginal bleeding between periods. Summary  Bacterial vaginosis is a vaginal infection that occurs when the normal balance of bacteria in the vagina is disrupted.  Because bacterial vaginosis increases your risk for STIs (sexually transmitted infections), getting treated can help reduce your risk for chlamydia, gonorrhea, herpes, and HIV (human immunodeficiency virus). Treatment is also important for preventing complications in pregnant women, because the condition can cause an early (premature) delivery.  This condition is treated with antibiotic medicines. These may be given as a pill, a vaginal cream, or a medicine that is put into the vagina (suppository). This information is not intended to replace advice given to you by your health care provider. Make sure you discuss any questions you have with your health care provider. Document Revised: 01/31/2017 Document Reviewed: 11/04/2015 Elsevier Patient Education  2020 Elsevier Inc.  

## 2019-12-15 NOTE — Progress Notes (Signed)
Bacterial vag   Date:  12/15/2019   Name:  Grace Harris   DOB:  February 28, 1966   MRN:  [id number]   Chief Complaint: Urinary Tract Infection (strong odor)  Urinary Tract Infection  This is a recurrent problem. The current episode started in the past 7 days. The problem has been waxing and waning. The pain is mild. There has been no fever. She is not sexually active. Associated symptoms include frequency. Pertinent negatives include no chills, discharge, flank pain, hematuria, hesitancy, nausea, sweats, urgency or vomiting. Associated symptoms comments: odor. Her past medical history is significant for recurrent UTIs. recent knee surg  Vaginal Discharge The patient's primary symptoms include genital itching, a genital odor and vaginal discharge. The patient's pertinent negatives include no genital lesions, genital rash, missed menses, pelvic pain or vaginal bleeding. The current episode started yesterday. The patient is experiencing no pain. Associated symptoms include frequency. Pertinent negatives include no abdominal pain, back pain, chills, constipation, diarrhea, dysuria, fever, flank pain, headaches, hematuria, nausea, rash, sore throat, urgency or vomiting. The vaginal discharge was clear, malodorous and thin. There has been no bleeding. She is not sexually active. She uses nothing for contraception. (Recent knee surg)    Lab Results  Component Value Date   CREATININE 0.81 10/13/2019   BUN 22 10/13/2019   NA 140 10/13/2019   K 4.8 10/13/2019   CL 104 10/13/2019   CO2 23 10/13/2019   Lab Results  Component Value Date   CHOL 214 (H) 09/09/2018   HDL 62 09/09/2018   LDLCALC 142 (H) 09/09/2018   TRIG 50 09/09/2018   CHOLHDL 3.5 09/09/2018   No results found for: TSH No results found for: HGBA1C Lab Results  Component Value Date   WBC 4.6 10/13/2019   HGB 12.6 10/13/2019   HCT 39.5 10/13/2019   MCV 83 10/13/2019   PLT 218 10/13/2019   Lab Results  Component Value Date    ALT 12 09/09/2018   AST 14 09/09/2018   ALKPHOS 45 09/09/2018   BILITOT 0.2 09/09/2018     Review of Systems  Constitutional: Negative for chills and fever.  HENT: Negative for drooling, ear discharge, ear pain and sore throat.   Respiratory: Negative for cough, shortness of breath and wheezing.   Cardiovascular: Negative for chest pain, palpitations and leg swelling.  Gastrointestinal: Negative for abdominal pain, blood in stool, constipation, diarrhea, nausea and vomiting.  Endocrine: Negative for polydipsia.  Genitourinary: Positive for frequency and vaginal discharge. Negative for dysuria, flank pain, hematuria, hesitancy, missed menses, pelvic pain and urgency.  Musculoskeletal: Negative for back pain, myalgias and neck pain.  Skin: Negative for rash.  Allergic/Immunologic: Negative for environmental allergies.  Neurological: Negative for dizziness and headaches.  Hematological: Does not bruise/bleed easily.  Psychiatric/Behavioral: Negative for suicidal ideas. The patient is not nervous/anxious.     Patient Active Problem List   Diagnosis Date Noted  . Varicose veins of left lower extremity with inflammation 04/14/2018  . Bilateral lower extremity edema 10/07/2017  . History of total left knee replacement (TKR) 10/07/2017    No Known Allergies  Past Surgical History:  Procedure Laterality Date  . CESAREAN SECTION     x 2  . JOINT REPLACEMENT Left 04/2017   @ [facility] [place]  . KNEE CLOSED REDUCTION Left 05/28/2017   Procedure: CLOSED MANIPULATION KNEE;  Surgeon: Erin Sons, MD;  Location: [facility] ORS;  Service: Orthopedics;  Laterality: Left;  Marland Kitchen VARICOSE VEIN SURGERY  Social History   Tobacco Use  . Smoking status: Never Smoker  . Smokeless tobacco: Never Used  Vaping Use  . Vaping Use: Never used  Substance Use Topics  . Alcohol use: Yes    Alcohol/week: 0.0 standard drinks    Comment: RARE  . Drug use: No     Medication list has  been reviewed and updated.  Current Meds  Medication Sig  . Clobetasol Prop Emollient Base (CLOBETASOL PROPIONATE E) 0.05 % emollient cream Apply 1 application topically 2 (two) times daily.  . naproxen (NAPROSYN) 500 MG tablet Take by mouth.    PHQ 2/9 Scores 12/15/2019 10/13/2019 09/09/2018 04/09/2017  PHQ - 2 Score 0 0 0 0  PHQ- 9 Score 0 0 1 0    GAD 7 : Generalized Anxiety Score 12/15/2019 10/13/2019  Nervous, Anxious, on Edge 0 0  Control/stop worrying 0 0  Worry too much - different things 0 0  Trouble relaxing 0 0  Restless 0 0  Easily annoyed or irritable 0 0  Afraid - awful might happen 0 0  Total GAD 7 Score 0 0    BP Readings from Last 3 Encounters:  12/15/19 130/80  10/13/19 110/76  09/09/18 120/64    Physical Exam Vitals and nursing note reviewed.  Constitutional:      General: She is not in acute distress.    Appearance: She is not diaphoretic.  HENT:     Head: Normocephalic and atraumatic.     Right Ear: Tympanic membrane, ear canal and external ear normal. There is no impacted cerumen.     Left Ear: Tympanic membrane, ear canal and external ear normal. There is no impacted cerumen.     Nose: Nose normal.  Eyes:     General:        Right eye: No discharge.        Left eye: No discharge.     Conjunctiva/sclera: Conjunctivae normal.     Pupils: Pupils are equal, round, and reactive to light.  Neck:     Thyroid: No thyromegaly.     Vascular: No JVD.  Cardiovascular:     Rate and Rhythm: Normal rate and regular rhythm.     Heart sounds: Normal heart sounds. No murmur heard.  No friction rub. No gallop.   Pulmonary:     Effort: Pulmonary effort is normal.     Breath sounds: Normal breath sounds. No wheezing or rhonchi.  Abdominal:     General: Bowel sounds are normal.     Palpations: Abdomen is soft. There is no mass.     Tenderness: There is no abdominal tenderness. There is no guarding.  Musculoskeletal:        General: Normal range of motion.      Cervical back: Normal range of motion and neck supple.  Lymphadenopathy:     Cervical: No cervical adenopathy.  Skin:    General: Skin is warm and dry.     Capillary Refill: Capillary refill takes less than 2 seconds.  Neurological:     Mental Status: She is alert.     Cranial Nerves: No cranial nerve deficit.     Deep Tendon Reflexes: Reflexes are normal and symmetric.     Wt Readings from Last 3 Encounters:  12/15/19 234 lb (106.1 kg)  10/13/19 234 lb (106.1 kg)  09/09/18 217 lb (98.4 kg)    BP 130/80   Pulse 80   Ht 5\' 2"  (1.575 m)   Wt 234 lb (  106.1 kg)   BMI 42.80 kg/m   Assessment and Plan: 1. Abnormal urine odor New onset.  Patient has noted a fishy-like odor to her urine.  There is minimal vaginal discharge without pruritus.  Dipstick notes very few white cells if any wet prep is positive for clue cells. - POCT urinalysis dipstick - POCT Wet Prep with KOH  2. Bacterial vaginitis New onset.  Present for 1 day.  Wet prep notes that there is clue cells.  We will treat with metronidazole 500 mg twice a day for 7 days.  Patient was given information. - POCT Wet Prep with KOH - metroNIDAZOLE (FLAGYL) 500 MG tablet; Take 1 tablet (500 mg total) by mouth 2 (two) times daily.  Dispense: 14 tablet; Refill: 0  3. Cystitis Because of recent UTIs and the change in urine odor thought she had a cystitis but there were very few white cells seen.  Instead we are looking at vaginal discharge is more likely to be concern - POCT urinalysis dipstick

## 2019-12-16 ENCOUNTER — Ambulatory Visit: Payer: BC Managed Care – PPO | Admitting: Physical Therapy

## 2019-12-16 ENCOUNTER — Encounter: Payer: Self-pay | Admitting: Physical Therapy

## 2019-12-16 ENCOUNTER — Other Ambulatory Visit: Payer: Self-pay

## 2019-12-16 DIAGNOSIS — M6281 Muscle weakness (generalized): Secondary | ICD-10-CM

## 2019-12-16 DIAGNOSIS — Z96651 Presence of right artificial knee joint: Secondary | ICD-10-CM | POA: Diagnosis not present

## 2019-12-16 DIAGNOSIS — R2689 Other abnormalities of gait and mobility: Secondary | ICD-10-CM

## 2019-12-16 DIAGNOSIS — M25661 Stiffness of right knee, not elsewhere classified: Secondary | ICD-10-CM

## 2019-12-16 NOTE — Therapy (Signed)
[facility] [address] [place], [place], [address] Phone: [phone]   Fax:  [phone]  Physical Therapy Treatment  Patient Details  Name: Grace Harris MRN: [id number] Date of Birth: 1966/01/04 Referring Provider (PT): Lowella Dell, MD   Encounter Date: 12/16/2019   PT End of Session - 12/16/19 0825    Visit Number 4    Number of Visits 9    Date for PT Re-Evaluation 01/04/20    PT Start Time 0818    PT Stop Time 0908    PT Time Calculation (min) 50 min    Activity Tolerance Patient tolerated treatment well    Behavior During Therapy [facility] for tasks assessed/performed           Past Medical History:  Diagnosis Date   Anemia    Arthritis     Past Surgical History:  Procedure Laterality Date   CESAREAN SECTION     x 2   JOINT REPLACEMENT Left 04/2017   @ [facility] IN [place]   KNEE CLOSED REDUCTION Left 05/28/2017   Procedure: CLOSED MANIPULATION KNEE;  Surgeon: Erin Sons, MD;  Location: [facility] ORS;  Service: Orthopedics;  Laterality: Left;   VARICOSE VEIN SURGERY      There were no vitals filed for this visit.   Subjective Assessment - 12/16/19 0824    Subjective Pt. states that her R knee is about 3/10 pain. Pt. states she has been icing at home, has questions on her HEP.    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    Limitations Walking    How long can you stand comfortably? 15 minutes    How long can you walk comfortably? 10 mins    Patient Stated Goals Pt wants to get the most amount of motion back.    Currently in Pain? Yes    Pain Score 3     Pain Location Knee    Pain Orientation Right             There Ex:  Nustep L1, initially seat position 7, moved to seat position 6 to improve R knee flexion. 10 mins  Seated contract relax R knee on blue mat: pt elevated with L foot on foot stool, R  foot off ground. Pt instructed to flex and ext knee against therapist pressure, then PT pushed flexion.   Total gym: 15x, pt able to achieve 92 deg flex. Pt required verbal cueing to continue to sink into flexion.  Supine PROM knee flex: pt on table, PT provided PROM to R knee. Pt able to achieve approx 95 deg flex, significant discomfort and tightness felt. Pt required distraction to maintain flex.        PT Long Term Goals - 12/07/19 1344      PT LONG TERM GOAL #1   Title Pt. will improve R knee flexion AROM/PROM to at least 100 degrees to improve functional mobility.    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    Baseline 10/5: pt can only stand for about 15 mins before needing a  seated rest break    Time 4    Period Weeks    Status New    Target Date 01/04/20      PT LONG TERM GOAL #3   Title Pt. will increase R knee flex and ext strength by at least half a muscle grade to improve functional mobility.    Baseline 10/5: R knee ext: 4/5, R knee flex: 4+/5    Time 4    Period Weeks    Status New    Target Date 01/04/20      PT LONG TERM GOAL #4   Title Pt. will be able to ambulate without AD and at most mild evidence of antalgic gait on the R to improve functional mobility.    Baseline 10/5: pt ambulates with moderate antalgic gait on R LE with quad cane    Time 4    Period Weeks    Status New    Target Date 01/04/20                 Plan - 12/16/19 0845    Clinical Impression Statement Pt. continues to demonstrate improvements in gait without AD. Pt. demonstrates improvements in R knee flexion on total gym achieving 92 deg flex. Pt. instructed on positioning in sitting when watching TV with towel roll under heel to maintain and improve her R knee ext. Pt. instructed  on how to complete supine heel slides with strap at home as well. Pt. continues to progress R knee flex PROM, achieving 95 deg AROM. Pt. will continue to benefit from skilled PT to improve R knee flex to improve normalized gait and to return to work.    Examination-Activity Limitations Bed Mobility;Lift;Squat;Stairs;Stand    Examination-Participation Restrictions Occupation    Stability/Clinical Decision Making Stable/Uncomplicated    Clinical Decision Making Low    Rehab Potential Good    PT Frequency 2x / week    PT Duration 4 weeks    PT Treatment/Interventions ADLs/Self Care Home Management;Cryotherapy;Therapeutic activities;Therapeutic exercise;Functional mobility training;Gait training;Balance training;Neuromuscular re-education;Patient/family education;Manual techniques;Scar mobilization;Passive range of motion;Stair training    PT Next Visit Plan Progress knee flexion    PT Home Exercise Plan [id number]    Consulted and Agree with Plan of Care Patient           Patient will benefit from skilled therapeutic intervention in order to improve the following deficits and impairments:  Abnormal gait, Decreased activity tolerance, Decreased endurance, Decreased strength, Difficulty walking, Impaired flexibility, Increased edema, Hypomobility, Increased fascial restricitons, Improper body mechanics, Postural dysfunction, Pain, Decreased mobility, Decreased range of motion  Visit Diagnosis: Status post right knee replacement  Decreased range of motion (ROM) of right knee  Muscle weakness (generalized)  Other abnormalities of gait and mobility     Problem List Patient Active Problem List   Diagnosis Date Noted   Varicose veins of left lower extremity with inflammation 04/14/2018   Bilateral lower extremity edema 10/07/2017   History of total left knee replacement (TKR) 10/07/2017   Cammie Mcgee, PT, DPT # [id number] Sharyn Creamer, SPT 12/16/2019, 9:14 AM  [facility] [address] [place], [place], [address] Phone: [phone]   Fax:  [phone]  Name: Grace Harris MRN: [id number] Date of Birth: Jun 24, 1965

## 2019-12-21 ENCOUNTER — Encounter: Payer: Self-pay | Admitting: Physical Therapy

## 2019-12-21 ENCOUNTER — Other Ambulatory Visit: Payer: Self-pay

## 2019-12-21 ENCOUNTER — Ambulatory Visit: Payer: BC Managed Care – PPO

## 2019-12-21 DIAGNOSIS — M25661 Stiffness of right knee, not elsewhere classified: Secondary | ICD-10-CM

## 2019-12-21 DIAGNOSIS — Z96651 Presence of right artificial knee joint: Secondary | ICD-10-CM | POA: Diagnosis not present

## 2019-12-21 DIAGNOSIS — R2689 Other abnormalities of gait and mobility: Secondary | ICD-10-CM

## 2019-12-21 DIAGNOSIS — M6281 Muscle weakness (generalized): Secondary | ICD-10-CM

## 2019-12-21 NOTE — Therapy (Signed)
[facility] [address] [place], [place], [address] Phone: [phone]   Fax:  [phone]  Physical Therapy Treatment  Patient Details  Name: Grace Harris MRN: [id number] Date of Birth: 1965-06-13 Referring Provider (PT): Lowella Dell, MD   Encounter Date: 12/21/2019   PT End of Session - 12/21/19 0818    Visit Number 5    Number of Visits 9    Date for PT Re-Evaluation 01/04/20    PT Start Time 0815    PT Stop Time 0900    PT Time Calculation (min) 45 min    Activity Tolerance Patient tolerated treatment well    Behavior During Therapy [facility] for tasks assessed/performed           Past Medical History:  Diagnosis Date  . Anemia   . Arthritis     Past Surgical History:  Procedure Laterality Date  . CESAREAN SECTION     x 2  . JOINT REPLACEMENT Left 04/2017   @ [facility] [place]  . KNEE CLOSED REDUCTION Left 05/28/2017   Procedure: CLOSED MANIPULATION KNEE;  Surgeon: Erin Sons, MD;  Location: [facility] ORS;  Service: Orthopedics;  Laterality: Left;  Marland Kitchen VARICOSE VEIN SURGERY      There were no vitals filed for this visit.   Subjective Assessment - 12/21/19 0817    Subjective Pt. states that she was dancing this weekend, caused her knee to be in a little bit of pain (4/10).    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    Limitations Walking    How long can you stand comfortably? 15 minutes    How long can you walk comfortably? 10 mins    Patient Stated Goals Pt wants to get the most amount of motion back.    Currently in Pain? Yes    Pain Score 4     Pain Location Knee    Pain Orientation Right    Pain Descriptors / Indicators Aching           There Ex:  Nustep L2 10 mins: first 5 mins at seat position 8, second 5 mins at seat position 7.    Seated R knee flex against RTB: 10x. Mod encouragement provided for  bending knee.   Seated R knee flexion/ext isometrics: 5x   Total gym: 15x, pt able to achieve 96 deg flex. Pt required verbal cueing to continue to sink into flexion.   STS practice: initially from table at lowest setting, 10x. Progressed to STS holding onto // bar with rolling stool as target. Pt able to complete STS with rolling stool at lowest setting. 15x  Squats at // bar: pt instructed to squat as low as possible holding onto bar to improve R knee flex. 10x  Standing butt kicks: 15x each leg, pt required mod verbal and tactile cueing to decrease trunk and hip compensations.                              PT Long Term Goals - 12/07/19 1344      PT LONG TERM GOAL #1   Title Pt. will improve R knee flexion AROM/PROM to at least 100 degrees to improve functional mobility.    Baseline 10/5: PROM: 73 deg    Time 4    Period Weeks  Status New    Target Date 01/04/20      PT LONG TERM GOAL #2   Title Pt. 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    Baseline 10/5: pt can only stand for about 15 mins before needing a seated rest break    Time 4    Period Weeks    Status New    Target Date 01/04/20      PT LONG TERM GOAL #3   Title Pt. will increase R knee flex and ext strength by at least half a muscle grade to improve functional mobility.    Baseline 10/5: R knee ext: 4/5, R knee flex: 4+/5    Time 4    Period Weeks    Status New    Target Date 01/04/20      PT LONG TERM GOAL #4   Title Pt. will be able to ambulate without AD and at most mild evidence of antalgic gait on the R to improve functional mobility.    Baseline 10/5: pt ambulates with moderate antalgic gait on R LE with quad cane    Time 4    Period Weeks    Status New    Target Date 01/04/20                 Plan - 12/21/19 0926    Clinical Impression Statement Pt. initially entered clinic with slightly  increased pain after dancing this weekend. Pt. continues to progress R knee flexion in session, able to achieve approx 96 deg on total gym. Pt. completed STS from various heights and seated isometrics for R knee flexion. Pt. instructed on maintaining extension based exercises at home due to focus being on improving flexion in therapy. Pt. continues to ambulate with antalgic gait on R knee. Pt. will continue to benefit from skilled PT to improve R knee flexion and normalize gait to return to work.    Examination-Activity Limitations Bed Mobility;Lift;Squat;Stairs;Stand    Examination-Participation Restrictions Occupation    Stability/Clinical Decision Making Stable/Uncomplicated    Clinical Decision Making Low    Rehab Potential Good    PT Frequency 2x / week    PT Duration 4 weeks    PT Treatment/Interventions ADLs/Self Care Home Management;Cryotherapy;Therapeutic activities;Therapeutic exercise;Functional mobility training;Gait training;Balance training;Neuromuscular re-education;Patient/family education;Manual techniques;Scar mobilization;Passive range of motion;Stair training    PT Next Visit Plan Progress knee flexion    PT Home Exercise Plan [id number]    Consulted and Agree with Plan of Care Patient           Patient will benefit from skilled therapeutic intervention in order to improve the following deficits and impairments:  Abnormal gait, Decreased activity tolerance, Decreased endurance, Decreased strength, Difficulty walking, Impaired flexibility, Increased edema, Hypomobility, Increased fascial restricitons, Improper body mechanics, Postural dysfunction, Pain, Decreased mobility, Decreased range of motion  Visit Diagnosis: Status post right knee replacement  Decreased range of motion (ROM) of right knee  Muscle weakness (generalized)  Other abnormalities of gait and mobility     Problem List Patient Active Problem List   Diagnosis Date Noted  . Varicose veins of left lower  extremity with inflammation 04/14/2018  . Bilateral lower extremity edema 10/07/2017  . History of total left knee replacement (TKR) 10/07/2017    Sharyn Creamer, SPT 12/21/2019, 9:34 AM  [place] [facility] [address] [place], [place], [address] Phone: [phone]   Fax:  [phone]  Name: EYONNA SANDSTROM MRN: [id number] Date of Birth: 1965-11-02

## 2019-12-23 ENCOUNTER — Encounter: Payer: Self-pay | Admitting: Physical Therapy

## 2019-12-23 ENCOUNTER — Ambulatory Visit: Payer: BC Managed Care – PPO | Admitting: Physical Therapy

## 2019-12-23 ENCOUNTER — Other Ambulatory Visit: Payer: Self-pay

## 2019-12-23 DIAGNOSIS — Z96651 Presence of right artificial knee joint: Secondary | ICD-10-CM | POA: Diagnosis not present

## 2019-12-23 DIAGNOSIS — R2689 Other abnormalities of gait and mobility: Secondary | ICD-10-CM

## 2019-12-23 DIAGNOSIS — M25661 Stiffness of right knee, not elsewhere classified: Secondary | ICD-10-CM

## 2019-12-23 DIAGNOSIS — M6281 Muscle weakness (generalized): Secondary | ICD-10-CM

## 2019-12-23 NOTE — Therapy (Signed)
[place] [facility] [address] [place], [place], [address] Phone: [phone]   Fax:  [phone]  Physical Therapy Treatment  Patient Details  Name: Grace Harris MRN: [id number] Date of Birth: [birth year] Referring Provider (PT): Lowella Dell, MD   Encounter Date: 12/23/2019   PT End of Session - 12/23/19 0825    Visit Number 6    Number of Visits 9    Date for PT Re-Evaluation 01/04/20    Authorization - Visit Number 6    Authorization - Number of Visits 10    PT Start Time 0817    PT Stop Time 0858    PT Time Calculation (min) 41 min    Activity Tolerance Patient tolerated treatment well    Behavior During Therapy [facility] for tasks assessed/performed           Past Medical History:  Diagnosis Date   Anemia    Arthritis     Past Surgical History:  Procedure Laterality Date   CESAREAN SECTION     x 2   JOINT REPLACEMENT Left 04/2017   @ [facility] IN [place]   KNEE CLOSED REDUCTION Left 05/28/2017   Procedure: CLOSED MANIPULATION KNEE;  Surgeon: Erin Sons, MD;  Location: [facility] ORS;  Service: Orthopedics;  Laterality: Left;   VARICOSE VEIN SURGERY      There were no vitals filed for this visit.   Subjective Assessment - 12/23/19 0822    Subjective Pt. states that R knee is still about 4/10 pain. Pt. has been walking.    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    Limitations Walking    How long can you stand comfortably? 15 minutes    How long can you walk comfortably? 10 mins    Patient Stated Goals Pt wants to get the most amount of motion back.    Currently in Pain? Yes    Pain Score 4     Pain Location Knee    Pain Orientation Right    Pain Descriptors / Indicators Aching            There Ex:  Nustep: L1 6 mins seat position 7, 4 mins seat position 6  Total gym: 10x warm up squats. 10x, pt starting at  96 deg. Pt able to progress to 100 deg on gym. 20 mins  Walking around clinic 5x with consistent cueing to maintain even stance time.   Manual:   Supine grade 3-4 inferior and superior mobs on R patella: 3x30 sec each direction  Supine grade 3-4 R tibial AP mobs: 3x30 s       Education given on doing more R knee flexion stretches over weekend to maintain 100 degs of R knee flex.          PT Long Term Goals - 12/07/19 1344      PT LONG TERM GOAL #1   Title Pt. will improve R knee flexion AROM/PROM to at least 100 degrees to improve functional mobility.    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    Baseline  10/5: pt can only stand for about 15 mins before needing a seated rest break    Time 4    Period Weeks    Status New    Target Date 01/04/20      PT LONG TERM GOAL #3   Title Pt. will increase R knee flex and ext strength by at least half a muscle grade to improve functional mobility.    Baseline 10/5: R knee ext: 4/5, R knee flex: 4+/5    Time 4    Period Weeks    Status New    Target Date 01/04/20      PT LONG TERM GOAL #4   Title Pt. will be able to ambulate without AD and at most mild evidence of antalgic gait on the R to improve functional mobility.    Baseline 10/5: pt ambulates with moderate antalgic gait on R LE with quad cane    Time 4    Period Weeks    Status New    Target Date 01/04/20                 Plan - 12/23/19 0900    Clinical Impression Statement Pt. continues to demonstrate antalgic gait on R knee, able to correct with consistent cueing ot maintain even stance time on both legs during walking. Pt. continues to progress R knee flex, acheiving 100 deg on total gym today. Pt. heavily educated on completing R knee flexion stretches at  home throughout the day to maintain gains made in therapy, as well as when watching TV to keep towel roll under heel on R side to maintain extension. Pt. will continue to benefit from skilled PT to improve R knee flexion and normalize gait to return to work.    Personal Factors and Comorbidities Fitness;Past/Current Experience    Examination-Activity Limitations Bed Mobility;Lift;Squat;Stairs;Stand    Examination-Participation Restrictions Occupation    Stability/Clinical Decision Making Stable/Uncomplicated    Clinical Decision Making Low    Rehab Potential Good    PT Frequency 2x / week    PT Duration 4 weeks    PT Treatment/Interventions ADLs/Self Care Home Management;Cryotherapy;Therapeutic activities;Therapeutic exercise;Functional mobility training;Gait training;Balance training;Neuromuscular re-education;Patient/family education;Manual techniques;Scar mobilization;Passive range of motion;Stair training    PT Next Visit Plan Progress knee flexion    PT Home Exercise Plan [id number]    Consulted and Agree with Plan of Care Patient           Patient will benefit from skilled therapeutic intervention in order to improve the following deficits and impairments:  Abnormal gait, Decreased activity tolerance, Decreased endurance, Decreased strength, Difficulty walking, Impaired flexibility, Increased edema, Hypomobility, Increased fascial restricitons, Improper body mechanics, Postural dysfunction, Pain, Decreased mobility, Decreased range of motion  Visit Diagnosis: Status post right knee replacement  Decreased range of motion (ROM) of right knee  Muscle weakness (generalized)  Other abnormalities of gait and mobility     Problem List Patient Active Problem List   Diagnosis Date Noted   Varicose veins of left lower extremity with inflammation 04/14/2018   Bilateral lower extremity edema 10/07/2017   History of total left knee replacement (TKR) 10/07/2017   Cammie Mcgee, PT,  DPT # [id number] Sharyn Creamer, SPT 12/23/2019, 2:53 PM  [place] [facility] [address] [place], [place], [address] Phone: [phone]   Fax:  [phone]  Name: Grace Harris MRN: [id number] Date of Birth: 1965-07-01

## 2019-12-28 ENCOUNTER — Other Ambulatory Visit: Payer: Self-pay

## 2019-12-28 ENCOUNTER — Encounter: Payer: Self-pay | Admitting: Physical Therapy

## 2019-12-28 ENCOUNTER — Ambulatory Visit: Payer: BC Managed Care – PPO | Admitting: Physical Therapy

## 2019-12-28 DIAGNOSIS — R2689 Other abnormalities of gait and mobility: Secondary | ICD-10-CM

## 2019-12-28 DIAGNOSIS — Z96651 Presence of right artificial knee joint: Secondary | ICD-10-CM | POA: Diagnosis not present

## 2019-12-28 DIAGNOSIS — M25661 Stiffness of right knee, not elsewhere classified: Secondary | ICD-10-CM

## 2019-12-28 DIAGNOSIS — M6281 Muscle weakness (generalized): Secondary | ICD-10-CM

## 2019-12-28 NOTE — Therapy (Signed)
[place] [facility] [address] [place], [place], [address] Phone: [phone]   Fax:  [phone]  Physical Therapy Treatment  Patient Details  Name: Grace Harris MRN: [id number] Date of Birth: October 29, 1965 Referring Provider (PT): Lowella Dell, MD   Encounter Date: 12/28/2019   PT End of Session - 12/28/19 0826    Visit Number 7    Number of Visits 9    Date for PT Re-Evaluation 01/04/20    Authorization - Visit Number 7    Authorization - Number of Visits 10    PT Start Time 0822    PT Stop Time 0912    PT Time Calculation (min) 50 min    Activity Tolerance Patient tolerated treatment well    Behavior During Therapy [facility] for tasks assessed/performed           Past Medical History:  Diagnosis Date  . Anemia   . Arthritis     Past Surgical History:  Procedure Laterality Date  . CESAREAN SECTION     x 2  . JOINT REPLACEMENT Left 04/2017   @ [facility] IN [place]  . KNEE CLOSED REDUCTION Left 05/28/2017   Procedure: CLOSED MANIPULATION KNEE;  Surgeon: Erin Sons, MD;  Location: [facility] ORS;  Service: Orthopedics;  Laterality: Left;  Marland Kitchen VARICOSE VEIN SURGERY      There were no vitals filed for this visit.   Subjective Assessment - 12/28/19 0825    Subjective Pt. states that her R knee still hurts when she walks. 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    Limitations Walking    How long can you stand comfortably? 15 minutes    How long can you walk comfortably? 10 mins    Patient Stated Goals Pt wants to get the most amount of motion back.    Currently in Pain? Yes    Pain Score 3     Pain Location Knee    Pain Orientation Right    Pain Descriptors / Indicators Aching             There Ex:  Nustep L2 10 mins: 5 mins at seat position 6, 5 mins at seat position 5  12" lunge step:  pushing flexion with R leg, 6x 10-20 second holds. Standing breaks in between lunges   Total Gym: 8x10s holds. Seated breaks in between reps    Manual:   Supine grade 3-4 inferior and superior mobs on R patella: 3x30 sec each direction  Supine grade 3-4 R tibial AP mobs: 3x30 s   Long sitting STM to R quad: 10 mins        PT Long Term Goals - 12/07/19 1344      PT LONG TERM GOAL #1   Title Pt. will improve R knee flexion AROM/PROM to at least 100 degrees to improve functional mobility.    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    Baseline 10/5: pt can only stand for about 15 mins before needing a seated rest break  Time 4    Period Weeks    Status New    Target Date 01/04/20      PT LONG TERM GOAL #3   Title Pt. will increase R knee flex and ext strength by at least half a muscle grade to improve functional mobility.    Baseline 10/5: R knee ext: 4/5, R knee flex: 4+/5    Time 4    Period Weeks    Status New    Target Date 01/04/20      PT LONG TERM GOAL #4   Title Pt. will be able to ambulate without AD and at most mild evidence of antalgic gait on the R to improve functional mobility.    Baseline 10/5: pt ambulates with moderate antalgic gait on R LE with quad cane    Time 4    Period Weeks    Status New    Target Date 01/04/20                 Plan - 12/28/19 0914    Clinical Impression Statement Pt. returns to therapy with slightly increased pain with R knee flex. Pt. able to achieve around 95 deg on total gym. Pt. practiced lunges today on 12" step, able to push into flexion on her own. Pt. responded well to manual threapy in the form of joint mobs and STM to R distal quad. Pt. will continue to benefit from skilled PT to improve R knee flexion and to  decrease pain to improve ability to complete pain free work days.    Personal Factors and Comorbidities Fitness;Past/Current Experience    Examination-Activity Limitations Bed Mobility;Lift;Squat;Stairs;Stand    Examination-Participation Restrictions Occupation    Stability/Clinical Decision Making Stable/Uncomplicated    Clinical Decision Making Low    Rehab Potential Good    PT Frequency 2x / week    PT Duration 4 weeks    PT Treatment/Interventions ADLs/Self Care Home Management;Cryotherapy;Therapeutic activities;Therapeutic exercise;Functional mobility training;Gait training;Balance training;Neuromuscular re-education;Patient/family education;Manual techniques;Scar mobilization;Passive range of motion;Stair training    PT Next Visit Plan Progress knee flexion    PT Home Exercise Plan [id number]    Consulted and Agree with Plan of Care Patient           Patient will benefit from skilled therapeutic intervention in order to improve the following deficits and impairments:  Abnormal gait, Decreased activity tolerance, Decreased endurance, Decreased strength, Difficulty walking, Impaired flexibility, Increased edema, Hypomobility, Increased fascial restricitons, Improper body mechanics, Postural dysfunction, Pain, Decreased mobility, Decreased range of motion  Visit Diagnosis: Status post right knee replacement  Decreased range of motion (ROM) of right knee  Muscle weakness (generalized)  Other abnormalities of gait and mobility     Problem List Patient Active Problem List   Diagnosis Date Noted  . Varicose veins of left lower extremity with inflammation 04/14/2018  . Bilateral lower extremity edema 10/07/2017  . History of total left knee replacement (TKR) 10/07/2017   Cammie Mcgee, PT, DPT # [id number] Sharyn Creamer, SPT 12/29/2019, 8:01 AM  [place] [address] [place], [place], [address] Phone: [phone]   Fax:   [phone]  Name: Grace Harris MRN: [id number] Date of Birth: Jun 17, 1965

## 2019-12-30 ENCOUNTER — Encounter: Payer: Self-pay | Admitting: Physical Therapy

## 2019-12-30 ENCOUNTER — Ambulatory Visit: Payer: BC Managed Care – PPO

## 2019-12-30 ENCOUNTER — Other Ambulatory Visit: Payer: Self-pay

## 2019-12-30 DIAGNOSIS — R2689 Other abnormalities of gait and mobility: Secondary | ICD-10-CM

## 2019-12-30 DIAGNOSIS — M6281 Muscle weakness (generalized): Secondary | ICD-10-CM

## 2019-12-30 DIAGNOSIS — Z96651 Presence of right artificial knee joint: Secondary | ICD-10-CM | POA: Diagnosis not present

## 2019-12-30 DIAGNOSIS — M25661 Stiffness of right knee, not elsewhere classified: Secondary | ICD-10-CM

## 2019-12-30 NOTE — Therapy (Signed)
[facility] [address] [place], [place], [address] Phone: [phone]   Fax:  [phone]  Physical Therapy Treatment  Patient Details  Name: Grace Harris MRN: [id number] Date of Birth: February 10, 1966 Referring Provider (PT): Lowella Dell, MD   Encounter Date: 12/30/2019   PT End of Session - 12/30/19 9735    Visit Number 8    Number of Visits 9    Date for PT Re-Evaluation 01/04/20    Authorization - Visit Number 8    Authorization - Number of Visits 10    PT Start Time 0820    PT Stop Time 0903    PT Time Calculation (min) 43 min    Activity Tolerance Patient tolerated treatment well    Behavior During Therapy [facility] for tasks assessed/performed           Past Medical History:  Diagnosis Date  . Anemia   . Arthritis     Past Surgical History:  Procedure Laterality Date  . CESAREAN SECTION     x 2  . JOINT REPLACEMENT Left 04/2017   @ [facility] [place]  . KNEE CLOSED REDUCTION Left 05/28/2017   Procedure: CLOSED MANIPULATION KNEE;  Surgeon: Erin Sons, MD;  Location: [facility] ORS;  Service: Orthopedics;  Laterality: Left;  Marland Kitchen VARICOSE VEIN SURGERY      There were no vitals filed for this visit.   Subjective Assessment - 12/30/19 0821    Subjective Pt. states she took naproxen and iced knee before she came. Pt. states R knee is 2/10.    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    Limitations Walking    How long can you stand comfortably? 15 minutes    How long can you walk comfortably? 10 mins    Patient Stated Goals Pt wants to get the most amount of motion back.    Currently in Pain? Yes    Pain Score 2     Pain Location Knee    Pain Orientation Right    Pain Descriptors / Indicators Aching            There Ex:  Nustep L2 10 mins, initial seat position 7 for 3 mins, seat position 6 for 2 mins, seat  position 5 for 5 mins.    12" lunge step: pushing flexion with R leg, 5x 10-20 second holds. Standing breaks in between lunges  Total Gym: 5x, pt knee irritated therefore discontinued. Pt able to achieve approx 95 deg.   Supine PROM R knee flex: able to achieve 100 deg with PT overpressure. 12x holds for 10-15 seconds. Breaks in between pushes.                           PT Long Term Goals - 12/07/19 1344      PT LONG TERM GOAL #1   Title Pt. will improve R knee flexion AROM/PROM to at least 100 degrees to improve functional mobility.    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    Baseline  10/5: pt can only stand for about 15 mins before needing a seated rest break    Time 4    Period Weeks    Status New    Target Date 01/04/20      PT LONG TERM GOAL #3   Title Pt. will increase R knee flex and ext strength by at least half a muscle grade to improve functional mobility.    Baseline 10/5: R knee ext: 4/5, R knee flex: 4+/5    Time 4    Period Weeks    Status New    Target Date 01/04/20      PT LONG TERM GOAL #4   Title Pt. will be able to ambulate without AD and at most mild evidence of antalgic gait on the R to improve functional mobility.    Baseline 10/5: pt ambulates with moderate antalgic gait on R LE with quad cane    Time 4    Period Weeks    Status New    Target Date 01/04/20                 Plan - 12/30/19 5573    Clinical Impression Statement Pt. continues to progress R knee flex. Pt. applied biofreeze to R knee to improve pain felt on R knee, test patch on back of hand was attempted first, no adverse reaction noted. Pt. achieved 100 deg PROM R knee flex. Pt. educated on stretch she can complete at home to progress R knee flex. Pt. will  continue to benefit from skilled PT to improve R knee flex and gait quality to improve ability to return to work.    Personal Factors and Comorbidities Fitness;Past/Current Experience    Examination-Activity Limitations Bed Mobility;Lift;Squat;Stairs;Stand    Examination-Participation Restrictions Occupation    Stability/Clinical Decision Making Stable/Uncomplicated    Clinical Decision Making Low    Rehab Potential Good    PT Frequency 2x / week    PT Duration 4 weeks    PT Treatment/Interventions ADLs/Self Care Home Management;Cryotherapy;Therapeutic activities;Therapeutic exercise;Functional mobility training;Gait training;Balance training;Neuromuscular re-education;Patient/family education;Manual techniques;Scar mobilization;Passive range of motion;Stair training    PT Next Visit Plan Progress knee flexion    PT Home Exercise Plan [id number]    Consulted and Agree with Plan of Care Patient           Patient will benefit from skilled therapeutic intervention in order to improve the following deficits and impairments:  Abnormal gait, Decreased activity tolerance, Decreased endurance, Decreased strength, Difficulty walking, Impaired flexibility, Increased edema, Hypomobility, Increased fascial restricitons, Improper body mechanics, Postural dysfunction, Pain, Decreased mobility, Decreased range of motion  Visit Diagnosis: Status post right knee replacement  Decreased range of motion (ROM) of right knee  Muscle weakness (generalized)  Other abnormalities of gait and mobility     Problem List Patient Active Problem List   Diagnosis Date Noted  . Varicose veins of left lower extremity with inflammation 04/14/2018  . Bilateral lower extremity edema 10/07/2017  . History of total left knee replacement (TKR) 10/07/2017    Sharyn Creamer, SPT 12/30/2019, 9:05 AM  [facility] [address] [place], [place], [address] Phone:  [phone]   Fax:  [phone]  Name: Grace Harris MRN: [id number] Date of Birth: [birth year]

## 2020-01-04 ENCOUNTER — Encounter: Payer: Self-pay | Admitting: Physical Therapy

## 2020-01-04 ENCOUNTER — Other Ambulatory Visit: Payer: Self-pay

## 2020-01-04 ENCOUNTER — Ambulatory Visit: Payer: BC Managed Care – PPO | Attending: Orthopaedic Surgery | Admitting: Physical Therapy

## 2020-01-04 DIAGNOSIS — R2689 Other abnormalities of gait and mobility: Secondary | ICD-10-CM

## 2020-01-04 DIAGNOSIS — M25661 Stiffness of right knee, not elsewhere classified: Secondary | ICD-10-CM

## 2020-01-04 DIAGNOSIS — M6281 Muscle weakness (generalized): Secondary | ICD-10-CM

## 2020-01-04 DIAGNOSIS — Z96651 Presence of right artificial knee joint: Secondary | ICD-10-CM | POA: Diagnosis present

## 2020-01-04 NOTE — Therapy (Signed)
[facility] [address] [place], [place], [address] Phone: [phone]   Fax:  [phone]  Physical Therapy Treatment and Recertification Recert period from 30/0/9233 - 01/04/2020  Patient Details  Name: Grace Harris MRN: [id number] Date of Birth: 01-May-1965 Referring Provider (PT): Julian Hy, MD   Encounter Date: 01/04/2020   PT End of Session - 01/04/20 0820    Visit Number 9    Number of Visits 17    Date for PT Re-Evaluation 02/01/20    Authorization - Visit Number 1    Authorization - Number of Visits 10    PT Start Time 0820    PT Stop Time 0905    PT Time Calculation (min) 45 min    Activity Tolerance Patient tolerated treatment well    Behavior During Therapy [facility] for tasks assessed/performed           Past Medical History:  Diagnosis Date   Anemia    Arthritis     Past Surgical History:  Procedure Laterality Date   CESAREAN SECTION     x 2   JOINT REPLACEMENT Left 04/2017   @ [place] Left 05/28/2017   Procedure: CLOSED MANIPULATION KNEE;  Surgeon: Leanor Kail, MD;  Location: [facility] ORS;  Service: Orthopedics;  Laterality: Left;   VARICOSE VEIN SURGERY      There were no vitals filed for this visit.   Subjective Assessment - 01/04/20 0821    Subjective Pt. states that her knee feels a little looser this morning, no pain today. She states that she felt some sharp pain when driving yesterday.    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    Limitations Walking    How long can you stand comfortably? 15 minutes    How long can you walk comfortably? 10 mins    Patient Stated Goals Pt wants to get the most amount of motion back.    Currently in Pain? No/denies            There Ex:  Nustep L2: 10 mins. 5 mins seat position 6, 5 mins seat position 5 to improve R  knee flex.   [facility] PT Assessment - 01/04/20 0001      Assessment   Medical Diagnosis S/P R TKA    Referring Provider (PT) Julian Hy, MD    Onset Date/Surgical Date 11/16/19      [place] residence      Prior Function   Level of Independence Independent         Goal Reassessment:  Strength test: 5/5 R knee flex and ext   Seated contract/relax R knee: 5x 10-15 sec holds  Standing 12" stair lunge stretch R knee: 8x15 sec holds  Total Gym: 7x, able to achieve up 106 deg R knee flex.   Supine PROM R knee flex: pt able to achieve 100 deg AROM initially, able to complete up to 106 deg PROM.   6MWT: pt able to complete 555 feet with one standing rest break.       PT Long Term Goals - 01/04/20 3545      PT LONG TERM GOAL #1   Title Pt. will improve R knee flexion AROM/PROM to at least 100 degrees to improve functional mobility.    Baseline 10/5: PROM: 73 deg;  11/2: PROM 106 deg, AROM 100 deg    Time 4    Period Weeks    Status Achieved    Target Date 01/04/20      PT LONG TERM GOAL #2   Title Pt. 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    Baseline 10/5: pt can only stand for about 15 mins before needing a seated rest break. 11/2: pt can still only stand about 15 mins    Time 4    Period Weeks    Status Partially Met    Target Date 02/01/20      PT LONG TERM GOAL #3   Title Pt. will increase R knee flex and ext strength by at least half a muscle grade to improve functional mobility.    Baseline 10/5: R knee ext: 4/5, R knee flex: 4+/5. 11/2: 5/5 R knee flex and ext.    Time 4    Period Weeks    Status Achieved    Target Date 01/04/20      PT LONG TERM GOAL #4   Title Pt. will be able to ambulate without AD and at most mild evidence of antalgic gait on the R to improve functional mobility.    Baseline 10/5: pt ambulates with moderate antalgic gait on R LE  with quad cane. 11/2: Pt ambulates with mild-moderate antalgic gait with no AD    Time 4    Period Weeks    Status Partially Met    Target Date 02/01/20      PT LONG TERM GOAL #5   Title Pt. will walk approx 800 feet during 6 MWT with no standing rest breaks to improve ability to return to work.    Baseline 11/2: 555 feet with one standing rest break    Time 4    Period Weeks    Status New    Target Date 02/01/20                 Plan - 01/04/20 0916    Clinical Impression Statement Goals reasssesed today. Pt. able to achieve 106 PROM R knee flex in supine with PT overpressure and on total gym today, and able to achieve 100 deg AROM R knee flex in supine. Pt. has acheived strength goal for R knee flex and ext, achieving 5/5 in both directions. Pt. is still limited in standing tolerance, she reports only being able to stand for approx 15 mins before needing a break. Pt. continues to ambulate with mild antalgic gait, however no longer uses an AD. New walking distance goal added, pt completed 6MWT and ambulated 555 feet with one standing rest break. Pt. will continue to benefit from skilled PT to improve strength and AROM of R knee flex to improve ability to return to work.    Personal Factors and Comorbidities Fitness;Past/Current Experience    Examination-Activity Limitations Bed Mobility;Lift;Squat;Stairs;Stand    Examination-Participation Restrictions Occupation    Stability/Clinical Decision Making Stable/Uncomplicated    Clinical Decision Making Low    Rehab Potential Good    PT Frequency 2x / week    PT Duration 4 weeks    PT Treatment/Interventions ADLs/Self Care Home Management;Cryotherapy;Therapeutic activities;Therapeutic exercise;Functional mobility training;Gait training;Balance training;Neuromuscular re-education;Patient/family education;Manual techniques;Scar mobilization;Passive range of motion;Stair training    PT Next Visit Plan Progress knee flexion    PT Home  Exercise Plan [id number]    Consulted and Agree with Plan of Care  Patient           Patient will benefit from skilled therapeutic intervention in order to improve the following deficits and impairments:  Abnormal gait, Decreased activity tolerance, Decreased endurance, Decreased strength, Difficulty walking, Impaired flexibility, Increased edema, Hypomobility, Increased fascial restricitons, Improper body mechanics, Postural dysfunction, Pain, Decreased mobility, Decreased range of motion  Visit Diagnosis: Status post right knee replacement  Decreased range of motion (ROM) of right knee  Muscle weakness (generalized)  Other abnormalities of gait and mobility     Problem List Patient Active Problem List   Diagnosis Date Noted   Varicose veins of left lower extremity with inflammation 04/14/2018   Bilateral lower extremity edema 10/07/2017   History of total left knee replacement (TKR) 10/07/2017   Pura Spice, PT, DPT # [id number] Carlyle Basques, SPT 01/04/2020, 1:22 PM  [facility] [address] [place], [place], [address] Phone: [phone]   Fax:  [phone]  Name: Grace Harris MRN: [id number] Date of Birth: [birth year]

## 2020-01-06 ENCOUNTER — Other Ambulatory Visit: Payer: Self-pay

## 2020-01-06 ENCOUNTER — Encounter: Payer: Self-pay | Admitting: Physical Therapy

## 2020-01-06 ENCOUNTER — Ambulatory Visit: Payer: BC Managed Care – PPO

## 2020-01-06 DIAGNOSIS — Z96651 Presence of right artificial knee joint: Secondary | ICD-10-CM

## 2020-01-06 DIAGNOSIS — M25661 Stiffness of right knee, not elsewhere classified: Secondary | ICD-10-CM

## 2020-01-06 DIAGNOSIS — R2689 Other abnormalities of gait and mobility: Secondary | ICD-10-CM

## 2020-01-06 DIAGNOSIS — M6281 Muscle weakness (generalized): Secondary | ICD-10-CM

## 2020-01-06 NOTE — Therapy (Signed)
[facility] [address] [place], [place], [address] Phone: [phone]   Fax:  [phone]  Physical Therapy Treatment  Physical Therapy Progress Note   Dates of reporting period  12/07/2019  to  01/06/2020  Patient Details  Name: Grace Harris MRN: [id number] Date of Birth: December 16, 1965 Referring Provider (PT): Julian Hy, MD   Encounter Date: 01/06/2020   PT End of Session - 01/06/20 0820    Visit Number 10    Number of Visits 17    Date for PT Re-Evaluation 02/01/20    Authorization - Visit Number 2    Authorization - Number of Visits 10    PT Start Time 0814    PT Stop Time 0856    PT Time Calculation (min) 42 min    Activity Tolerance Patient tolerated treatment well    Behavior During Therapy [facility] for tasks assessed/performed           Past Medical History:  Diagnosis Date  . Anemia   . Arthritis     Past Surgical History:  Procedure Laterality Date  . CESAREAN SECTION     x 2  . JOINT REPLACEMENT Left 04/2017   @ [place] Left 05/28/2017   Procedure: CLOSED MANIPULATION KNEE;  Surgeon: Leanor Kail, MD;  Location: [facility] ORS;  Service: Orthopedics;  Laterality: Left;  Marland Kitchen VARICOSE VEIN SURGERY      There were no vitals filed for this visit.   Subjective Assessment - 01/06/20 0819    Subjective Pt. states that her knee feels good today, no pain.    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    Limitations Walking    How long can you stand comfortably? 15 minutes    How long can you walk comfortably? 10 mins    Patient Stated Goals Pt wants to get the most amount of motion back.    Currently in Pain? No/denies            There Ex:  SciFit seat position 7 level 4.9 to improve R knee flex 13mns. Monitored throughout, improved knee flexion noted over time, pt able to  complete a full revolution.   Walking in hallway with metronome: 81bpm, with mild verbal cueing to maintain on beat. Pt still demonstrates lateral leaning during R stance phase.  TRX squats to 14" box, approx 20 reps total  Lunge stretch on 12" step: 10x10s, cueing for pushing hips forward to increase stretch in R knee  Step up with R leg onto 6" step: 15x, cueing for increased drive through R glutes, pt felt slight discomfort in medial proximal tibial area, not enough to discontinue exercise. Pt progressed to stepping up on stepstool for additional 10 reps.   Manual:  10 mins STM to R quad. Pt felt decreased pain/discomfort after STM. PT felt slight tightness medial quads.        PT Long Term Goals - 01/04/20 08563     PT LONG TERM GOAL #1   Title Pt. will improve R knee flexion AROM/PROM to at least 100 degrees to improve functional mobility.    Baseline 10/5: PROM: 73 deg; 11/2: PROM 106 deg, AROM 100 deg    Time 4    Period Weeks    Status Achieved    Target Date 01/04/20  PT LONG TERM GOAL #2   Title Pt. 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    Baseline 10/5: pt can only stand for about 15 mins before needing a seated rest break. 11/2: pt can still only stand about 15 mins    Time 4    Period Weeks    Status Partially Met    Target Date 02/01/20      PT LONG TERM GOAL #3   Title Pt. will increase R knee flex and ext strength by at least half a muscle grade to improve functional mobility.    Baseline 10/5: R knee ext: 4/5, R knee flex: 4+/5. 11/2: 5/5 R knee flex and ext.    Time 4    Period Weeks    Status Achieved    Target Date 01/04/20      PT LONG TERM GOAL #4   Title Pt. will be able to ambulate without AD and at most mild evidence of antalgic gait on the R to improve functional mobility.    Baseline 10/5: pt ambulates with moderate antalgic gait on R LE with quad cane. 11/2: Pt  ambulates with mild-moderate antalgic gait with no AD    Time 4    Period Weeks    Status Partially Met    Target Date 02/01/20      PT LONG TERM GOAL #5   Title Pt. will walk approx 800 feet during 6 MWT with no standing rest breaks to improve ability to return to work.    Baseline 11/2: 555 feet with one standing rest break    Time 4    Period Weeks    Status New    Target Date 02/01/20                 Plan - 01/06/20 8144    Clinical Impression Statement Pt. progress evaluated today. Goals reassessed last session, see updated goals for details. Pt. has continued to demonstrate improvements in R knee flexion and in gait, continues to demonstrates slight antalgic gait on R. Pt. states she would like to continue improving her standing/walking endurance. Pt. able to complete TRX squats to low suface as well as beginning completing single leg step ups with R leg. Pt. additionally now able to use SciFit to warm up. Pt. will continue to benefit from skilled PT to improve endurance, strength, and AROM of R knee to improve ability to return to work.    Personal Factors and Comorbidities Fitness;Past/Current Experience    Examination-Activity Limitations Bed Mobility;Lift;Squat;Stairs;Stand    Examination-Participation Restrictions Occupation    Stability/Clinical Decision Making Stable/Uncomplicated    Clinical Decision Making Low    Rehab Potential Good    PT Frequency 2x / week    PT Duration 4 weeks    PT Treatment/Interventions ADLs/Self Care Home Management;Cryotherapy;Therapeutic activities;Therapeutic exercise;Functional mobility training;Gait training;Balance training;Neuromuscular re-education;Patient/family education;Manual techniques;Scar mobilization;Passive range of motion;Stair training    PT Next Visit Plan Progress knee flexion    PT Home Exercise Plan [id number]    Consulted and Agree with Plan of Care Patient           Patient will benefit from skilled therapeutic  intervention in order to improve the following deficits and impairments:  Abnormal gait, Decreased activity tolerance, Decreased endurance, Decreased strength, Difficulty walking, Impaired flexibility, Increased edema, Hypomobility, Increased fascial restricitons, Improper body mechanics, Postural dysfunction, Pain, Decreased mobility, Decreased range of motion  Visit Diagnosis: Status post right knee replacement  Decreased range of motion (ROM) of right knee  Muscle weakness (generalized)  Other abnormalities of gait and mobility     Problem List Patient Active Problem List   Diagnosis Date Noted  . Varicose veins of left lower extremity with inflammation 04/14/2018  . Bilateral lower extremity edema 10/07/2017  . History of total left knee replacement (TKR) 10/07/2017    Carlyle Basques, SPT 01/06/2020, 9:52 AM  [place] [facility] [address] [place], [place], [address] Phone: [phone]   Fax:  [phone]  Name: Grace Harris MRN: [id number] Date of Birth: November 30, 1965

## 2020-01-11 ENCOUNTER — Other Ambulatory Visit: Payer: Self-pay

## 2020-01-11 ENCOUNTER — Encounter: Payer: Self-pay | Admitting: Physical Therapy

## 2020-01-11 ENCOUNTER — Ambulatory Visit: Payer: BC Managed Care – PPO | Admitting: Physical Therapy

## 2020-01-11 DIAGNOSIS — R2689 Other abnormalities of gait and mobility: Secondary | ICD-10-CM

## 2020-01-11 DIAGNOSIS — Z96651 Presence of right artificial knee joint: Secondary | ICD-10-CM | POA: Diagnosis not present

## 2020-01-11 DIAGNOSIS — M6281 Muscle weakness (generalized): Secondary | ICD-10-CM

## 2020-01-11 DIAGNOSIS — M25661 Stiffness of right knee, not elsewhere classified: Secondary | ICD-10-CM

## 2020-01-11 NOTE — Therapy (Signed)
[facility] [address] [place], [place], [address] Phone: [phone]   Fax:  [phone]  Physical Therapy Treatment  Patient Details  Name: Grace Harris MRN: [id number] Date of Birth: November 23, 1965 Referring Provider (PT): Julian Hy, MD   Encounter Date: 01/11/2020   PT End of Session - 01/11/20 0823    Visit Number 11    Number of Visits 17    Date for PT Re-Evaluation 02/01/20    Authorization - Visit Number 3    Authorization - Number of Visits 10    PT Start Time 0818    PT Stop Time 0904    PT Time Calculation (min) 46 min    Activity Tolerance Patient tolerated treatment well    Behavior During Therapy [facility] for tasks assessed/performed           Past Medical History:  Diagnosis Date  . Anemia   . Arthritis     Past Surgical History:  Procedure Laterality Date  . CESAREAN SECTION     x 2  . JOINT REPLACEMENT Left 04/2017   @ [place] Left 05/28/2017   Procedure: CLOSED MANIPULATION KNEE;  Surgeon: Leanor Kail, MD;  Location: [facility] ORS;  Service: Orthopedics;  Laterality: Left;  Marland Kitchen VARICOSE VEIN SURGERY      There were no vitals filed for this visit.   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 Slightly painful around patellar tendon. 5/10 pain in R knee over weekend, currently 2/10.    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    Limitations Walking    How long can you stand comfortably? 15 minutes    How long can you walk comfortably? 10 mins    Patient Stated Goals Pt wants to get the most amount of motion back.    Currently in Pain? Yes    Pain Score 2     Pain Location Knee    Pain Orientation Right    Pain Descriptors / Indicators Aching            There Ex:  SciFit level 5 seat position 6 10 mins. Monitored throughout, improved knee flexion noted over time, pt able to complete a full revolution.   Lunge stretch on 12" step: 10x15s, cueing for pushing hips forward to increase stretch in R knee  TRX squats to 14" box, approx 20 reps total. Cueing to maintain even weight on both feet and pushing through hips to come to standing.    Manual:  12 mins STM to R quad. Pt felt decreased pain/discomfort after STM. PT felt slight tightness medial quads. Pt R heel on towel roll to promote R knee extension, educated on improving extension at home with towel roll or pillow under heel.         PT Long Term Goals - 01/04/20 6962      PT LONG TERM GOAL #1   Title Pt. will improve R knee flexion AROM/PROM to at least 100 degrees to improve functional mobility.    Baseline 10/5: PROM: 73 deg; 11/2: PROM 106 deg, AROM 100 deg    Time 4    Period Weeks    Status Achieved    Target  Date 01/04/20      PT LONG TERM GOAL #2   Title Pt. 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    Baseline 10/5: pt can only stand for about 15 mins before needing a seated rest break. 11/2: pt can still only stand about 15 mins    Time 4    Period Weeks    Status Partially Met    Target Date 02/01/20      PT LONG TERM GOAL #3   Title Pt. will increase R knee flex and ext strength by at least half a muscle grade to improve functional mobility.    Baseline 10/5: R knee ext: 4/5, R knee flex: 4+/5. 11/2: 5/5 R knee flex and ext.    Time 4    Period Weeks    Status Achieved    Target Date 01/04/20      PT LONG TERM GOAL #4   Title Pt. will be able to ambulate without AD and at most mild evidence of antalgic gait on the R to improve functional mobility.    Baseline 10/5: pt ambulates with moderate antalgic gait on R LE with quad cane. 11/2: Pt ambulates with mild-moderate antalgic gait  with no AD    Time 4    Period Weeks    Status Partially Met    Target Date 02/01/20      PT LONG TERM GOAL #5   Title Pt. will walk approx 800 feet during 6 MWT with no standing rest breaks to improve ability to return to work.    Baseline 11/2: 555 feet with one standing rest break    Time 4    Period Weeks    Status New    Target Date 02/01/20                 Plan - 01/11/20 0850    Clinical Impression Statement Pt. returns to therapy with slightly increased pain after weekend of increased standing time and activity. Pt. responded well to manual therapy on R quads to decrease pain. Pt. does well with TRX squats and lunge stretch for R knee to improve flexion. During manual therpay, pt had R heel on towel roll to maintain ext. Educated on keeping towel roll under R heel at home. Pt. states knee feels slightly better after therapy today. Pt. additionally was better able to demonstrate decreased antalgic gait on R with mild verbal cueing when walking around gym. Pt. will conitnue to benefit from skilled PT to improve gait and R knee AROM to improve ability to complete work related tasks independently.    Personal Factors and Comorbidities Fitness;Past/Current Experience    Examination-Activity Limitations Bed Mobility;Lift;Squat;Stairs;Stand    Examination-Participation Restrictions Occupation    Stability/Clinical Decision Making Stable/Uncomplicated    Clinical Decision Making Low    Rehab Potential Good    PT Frequency 2x / week    PT Duration 4 weeks    PT Treatment/Interventions ADLs/Self Care Home Management;Cryotherapy;Therapeutic activities;Therapeutic exercise;Functional mobility training;Gait training;Balance training;Neuromuscular re-education;Patient/family education;Manual techniques;Scar mobilization;Passive range of motion;Stair training    PT Next Visit Plan Progress knee flexion    PT Home Exercise Plan [id number]    Consulted and Agree with Plan of Care Patient            Patient will benefit from skilled therapeutic intervention in order to improve the following deficits and impairments:  Abnormal gait, Decreased activity tolerance, Decreased  endurance, Decreased strength, Difficulty walking, Impaired flexibility, Increased edema, Hypomobility, Increased fascial restricitons, Improper body mechanics, Postural dysfunction, Pain, Decreased mobility, Decreased range of motion  Visit Diagnosis: Status post right knee replacement  Decreased range of motion (ROM) of right knee  Muscle weakness (generalized)  Other abnormalities of gait and mobility     Problem List Patient Active Problem List   Diagnosis Date Noted  . Varicose veins of left lower extremity with inflammation 04/14/2018  . Bilateral lower extremity edema 10/07/2017  . History of total left knee replacement (TKR) 10/07/2017   Pura Spice, PT, DPT # [id number] 01/11/2020, 11:45 AM  [facility] [address] [place], [place], [address] Phone: [phone]   Fax:  [phone]  Name: Grace Harris MRN: [id number] Date of Birth: Dec 20, 1965

## 2020-01-13 ENCOUNTER — Ambulatory Visit: Payer: BC Managed Care – PPO | Admitting: Physical Therapy

## 2020-01-13 ENCOUNTER — Encounter: Payer: Self-pay | Admitting: Physical Therapy

## 2020-01-13 ENCOUNTER — Other Ambulatory Visit: Payer: Self-pay

## 2020-01-13 DIAGNOSIS — R2689 Other abnormalities of gait and mobility: Secondary | ICD-10-CM

## 2020-01-13 DIAGNOSIS — M25661 Stiffness of right knee, not elsewhere classified: Secondary | ICD-10-CM

## 2020-01-13 DIAGNOSIS — Z96651 Presence of right artificial knee joint: Secondary | ICD-10-CM | POA: Diagnosis not present

## 2020-01-13 DIAGNOSIS — M6281 Muscle weakness (generalized): Secondary | ICD-10-CM

## 2020-01-13 NOTE — Therapy (Signed)
[facility] [address] [place], [place], [address] Phone: [phone]   Fax:  [phone]  Physical Therapy Treatment  Patient Details  Name: Grace Harris MRN: [id number] Date of Birth: [birth year] Referring Provider (PT): Julian Hy, MD   Encounter Date: 01/13/2020   PT End of Session - 01/13/20 0821    Visit Number 12    Number of Visits 17    Date for PT Re-Evaluation 02/01/20    Authorization - Visit Number 4    Authorization - Number of Visits 10    PT Start Time 0820    PT Stop Time 0900    PT Time Calculation (min) 40 min    Activity Tolerance Patient tolerated treatment well    Behavior During Therapy [facility] for tasks assessed/performed           Past Medical History:  Diagnosis Date  . Anemia   . Arthritis     Past Surgical History:  Procedure Laterality Date  . CESAREAN SECTION     x 2  . JOINT REPLACEMENT Left 04/2017   @ [place] Left 05/28/2017   Procedure: CLOSED MANIPULATION KNEE;  Surgeon: Leanor Kail, MD;  Location: [facility] ORS;  Service: Orthopedics;  Laterality: Left;  Marland Kitchen VARICOSE VEIN SURGERY      There were no vitals filed for this visit.   Subjective Assessment - 01/13/20 0823    Subjective Pt. states that she has been doing her exercises at home. Pt. ambulates into clinic with slightly increased antalgic gait, she states R knee is feeling a bit sore today 3/10.    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    Limitations Walking    How long can you stand comfortably? 15 minutes    How long can you walk comfortably? 10 mins    Patient Stated Goals Pt wants to get the most amount of motion back.    Currently in Pain? Yes    Pain Score 3     Pain Location Knee    Pain Orientation Right    Pain Descriptors / Indicators Aching            There  Ex:  SciFit Seat position 6, 6 mins. Pt experienced slight discomfort in hip, therefore discontinued. Pt able to complete full revolutions, supervised throughout.   Nustep 5 mins seat position 5. Pt monitored throughout to improve R knee flexion.   Walking hurdles: outside // bars, pt instructed to lead with R leg over 6" hurdles. Pt required cueing to squeeze leg muscles on R leg upon landing on other side of hurdle, this decreased knee pain felt. Pt completed 8x stepping over 3 hurdles spaced apart. Pt progressed to reciprocal pattern over hurdles placed together and marched that height for length of agility ladder 5x. Verbal cueing required for maintaining pure hip and knee flexion.   3" plinth step back: pt attempted step back with R leg to L heel tap. Pt felt uncomfortable with activity, therefore discontinued   Total Gym: 5x holds for 10-15 seconds. Pt required mod-significant encouragement to flex as much as possible.       PT Long Term Goals - 01/04/20 5397      PT LONG TERM GOAL #1   Title Pt. will improve R knee flexion AROM/PROM to at least 100 degrees to  improve functional mobility.    Baseline 10/5: PROM: 73 deg; 11/2: PROM 106 deg, AROM 100 deg    Time 4    Period Weeks    Status Achieved    Target Date 01/04/20      PT LONG TERM GOAL #2   Title Pt. 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    Baseline 10/5: pt can only stand for about 15 mins before needing a seated rest break. 11/2: pt can still only stand about 15 mins    Time 4    Period Weeks    Status Partially Met    Target Date 02/01/20      PT LONG TERM GOAL #3   Title Pt. will increase R knee flex and ext strength by at least half a muscle grade to improve functional mobility.    Baseline 10/5: R knee ext: 4/5, R knee flex: 4+/5. 11/2: 5/5 R knee flex and ext.    Time 4    Period Weeks    Status Achieved    Target Date 01/04/20       PT LONG TERM GOAL #4   Title Pt. will be able to ambulate without AD and at most mild evidence of antalgic gait on the R to improve functional mobility.    Baseline 10/5: pt ambulates with moderate antalgic gait on R LE with quad cane. 11/2: Pt ambulates with mild-moderate antalgic gait with no AD    Time 4    Period Weeks    Status Partially Met    Target Date 02/01/20      PT LONG TERM GOAL #5   Title Pt. will walk approx 800 feet during 6 MWT with no standing rest breaks to improve ability to return to work.    Baseline 11/2: 555 feet with one standing rest break    Time 4    Period Weeks    Status New    Target Date 02/01/20              Plan - 01/13/20 0859    Clinical Impression Statement Pt. returns to therapy with more discomfort in knee, but reports of going out and shopping in community more. Pt. session focused on improving gait today with use of hurdles to encourage R hip and knee flexion during walking. Pt. able to ambulate over hurdles and length of agility ladder with reciprocal marching. Pt. would experience pain with landing on R leg over hurdles, cueing ot contract leg muslces upon landing decreased pain. Pt. ended session on Total Gym to continue encouraging R knee flexion. Pt. will continue to benefit from skilled PT to improve gait and R knee AROM to improve gait and improve ability to return to work.    Personal Factors and Comorbidities Fitness;Past/Current Experience    Examination-Activity Limitations Bed Mobility;Lift;Squat;Stairs;Stand    Examination-Participation Restrictions Occupation    Stability/Clinical Decision Making Stable/Uncomplicated    Clinical Decision Making Low    Rehab Potential Good    PT Frequency 2x / week    PT Duration 4 weeks    PT Treatment/Interventions ADLs/Self Care Home Management;Cryotherapy;Therapeutic activities;Therapeutic exercise;Functional mobility training;Gait training;Balance training;Neuromuscular  re-education;Patient/family education;Manual techniques;Scar mobilization;Passive range of motion;Stair training    PT Next Visit Plan Progress knee flexion    PT Home Exercise Plan [id number]    Consulted and Agree with Plan of Care Patient  Patient will benefit from skilled therapeutic intervention in order to improve the following deficits and impairments:  Abnormal gait, Decreased activity tolerance, Decreased endurance, Decreased strength, Difficulty walking, Impaired flexibility, Increased edema, Hypomobility, Increased fascial restricitons, Improper body mechanics, Postural dysfunction, Pain, Decreased mobility, Decreased range of motion  Visit Diagnosis: Status post right knee replacement  Decreased range of motion (ROM) of right knee  Muscle weakness (generalized)  Other abnormalities of gait and mobility     Problem List Patient Active Problem List   Diagnosis Date Noted  . Varicose veins of left lower extremity with inflammation 04/14/2018  . Bilateral lower extremity edema 10/07/2017  . History of total left knee replacement (TKR) 10/07/2017   Pura Spice, PT, DPT # [id number] Carlyle Basques, SPT 01/13/2020, 10:18 AM  [facility] [address] [place], [place], [address] Phone: [phone]   Fax:  [phone]  Name: Grace Harris MRN: [id number] Date of Birth: [birth year]

## 2020-01-18 ENCOUNTER — Ambulatory Visit: Payer: BC Managed Care – PPO | Admitting: Physical Therapy

## 2020-01-18 ENCOUNTER — Other Ambulatory Visit: Payer: Self-pay

## 2020-01-18 ENCOUNTER — Encounter: Payer: Self-pay | Admitting: Physical Therapy

## 2020-01-18 DIAGNOSIS — M6281 Muscle weakness (generalized): Secondary | ICD-10-CM

## 2020-01-18 DIAGNOSIS — Z96651 Presence of right artificial knee joint: Secondary | ICD-10-CM

## 2020-01-18 DIAGNOSIS — R2689 Other abnormalities of gait and mobility: Secondary | ICD-10-CM

## 2020-01-18 DIAGNOSIS — M25661 Stiffness of right knee, not elsewhere classified: Secondary | ICD-10-CM

## 2020-01-18 NOTE — Therapy (Signed)
[facility] [address] [place], [place], [address] Phone: [phone]   Fax:  [phone]  Physical Therapy Treatment  Patient Details  Name: Grace Harris MRN: [id number] Date of Birth: 1965/07/25 Referring Provider (PT): Julian Hy, MD   Encounter Date: 01/18/2020   PT End of Session - 01/18/20 0824    Visit Number 13    Number of Visits 17    Date for PT Re-Evaluation 02/01/20    Authorization - Visit Number 5    Authorization - Number of Visits 10    PT Start Time 0821    PT Stop Time 8341    PT Time Calculation (min) 38 min    Activity Tolerance Patient tolerated treatment well    Behavior During Therapy [facility] for tasks assessed/performed           Past Medical History:  Diagnosis Date   Anemia    Arthritis     Past Surgical History:  Procedure Laterality Date   CESAREAN SECTION     x 2   JOINT REPLACEMENT Left 04/2017   @ [place] Left 05/28/2017   Procedure: CLOSED MANIPULATION KNEE;  Surgeon: Leanor Kail, MD;  Location: [facility] ORS;  Service: Orthopedics;  Laterality: Left;   VARICOSE VEIN SURGERY      There were no vitals filed for this visit.   Subjective Assessment - 01/18/20 0821    Subjective Pt. states that she went to the gym and walked around her neighborhood this past weekend, but felt that she was in a lot of pain after all the activity. She tried naproxen and using ice.  Pt. scheduled to RTW on 12/6 per MD and returns to MD in January.  Pt. concerned about being able to walk from car to work on [employer].    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    Limitations Walking    How long can you stand comfortably? 15 minutes    How long can you walk comfortably? 10 mins    Patient Stated Goals Pt wants to get the most amount of motion back.    Currently in  Pain? Yes    Pain Score 2     Pain Location Knee    Pain Orientation Right    Pain Descriptors / Indicators Aching              There Ex:  Nustep L2 seat position 5 to improve R knee flexion, pt felt stretch in knee throughout duration of activity  Lateral walking against RTB: 5x length of // bars. Progressed to mini squat lateral walking 5x  Gait training:   Metronome set to 85, with verbal cueing to maintain even step timing. Pt able to improve gait, but still demonstrates decreased stance time on the R slightly compared to the L, additionally demonstrates mod lateral trunk leaning. Pt moved to // bars, using both hands on bars to decrease lateral sway. Pt able to better control lateral swaying this way, progressed to one hand on bar, and lateral swaying slightly returned. Pt demonstrates slight trendelenburg gait bilat.          PT Long Term Goals - 01/04/20 9622      PT LONG TERM GOAL #1   Title Pt. will improve R knee flexion AROM/PROM to at least 100 degrees to  improve functional mobility.    Baseline 10/5: PROM: 73 deg; 11/2: PROM 106 deg, AROM 100 deg    Time 4    Period Weeks    Status Achieved    Target Date 01/04/20      PT LONG TERM GOAL #2   Title Pt. 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    Baseline 10/5: pt can only stand for about 15 mins before needing a seated rest break. 11/2: pt can still only stand about 15 mins    Time 4    Period Weeks    Status Partially Met    Target Date 02/01/20      PT LONG TERM GOAL #3   Title Pt. will increase R knee flex and ext strength by at least half a muscle grade to improve functional mobility.    Baseline 10/5: R knee ext: 4/5, R knee flex: 4+/5. 11/2: 5/5 R knee flex and ext.    Time 4    Period Weeks    Status Achieved    Target Date 01/04/20      PT LONG TERM GOAL #4   Title Pt. will be able to ambulate without AD and at most mild  evidence of antalgic gait on the R to improve functional mobility.    Baseline 10/5: pt ambulates with moderate antalgic gait on R LE with quad cane. 11/2: Pt ambulates with mild-moderate antalgic gait with no AD    Time 4    Period Weeks    Status Partially Met    Target Date 02/01/20      PT LONG TERM GOAL #5   Title Pt. will walk approx 800 feet during 6 MWT with no standing rest breaks to improve ability to return to work.    Baseline 11/2: 555 feet with one standing rest break    Time 4    Period Weeks    Status New    Target Date 02/01/20                 Plan - 01/18/20 0854    Clinical Impression Statement Pt. returns to therapy with biggest complaints of gait still not being "normal." Session today focused on gait training and trying to find solutions to improve her gait and ways to help her improve gait at home. Pt. and PT used metrnonome to help with even stance time per leg, which improved her gait. PT noticed mild trendelenburg gait bilaterally, pt was given glute med strengthening exercises to improve glute med strength to keep hips level during ambulation. Pt also continues to demonstrate lateral sway, which can be corrected with use of both hands on // bars. Pt. and PT discussed treadmill usage at gym to progress gait training skills learned in therapy. Pt. and PT discussed wanting to build an endurance program with the bike or treadmill for her to improve her ability to walk longer distances without fatigue. Pt. will conitnue to benefit from skilled PT to improve gait and ability to return to work without pain.    Personal Factors and Comorbidities Fitness;Past/Current Experience    Examination-Activity Limitations Bed Mobility;Lift;Squat;Stairs;Stand    Examination-Participation Restrictions Occupation    Stability/Clinical Decision Making Stable/Uncomplicated    Clinical Decision Making Low    Rehab Potential Good    PT Frequency 2x / week    PT Duration 4 weeks     PT Treatment/Interventions ADLs/Self Care Home  Management;Cryotherapy;Therapeutic activities;Therapeutic exercise;Functional mobility training;Gait training;Balance training;Neuromuscular re-education;Patient/family education;Manual techniques;Scar mobilization;Passive range of motion;Stair training    PT Next Visit Plan Progress knee flexion, put together endurance program for gym, gait training    PT Home Exercise Plan [id number]    Consulted and Agree with Plan of Care Patient           Patient will benefit from skilled therapeutic intervention in order to improve the following deficits and impairments:  Abnormal gait, Decreased activity tolerance, Decreased endurance, Decreased strength, Difficulty walking, Impaired flexibility, Increased edema, Hypomobility, Increased fascial restricitons, Improper body mechanics, Postural dysfunction, Pain, Decreased mobility, Decreased range of motion  Visit Diagnosis: Status post right knee replacement  Decreased range of motion (ROM) of right knee  Other abnormalities of gait and mobility  Muscle weakness (generalized)     Problem List Patient Active Problem List   Diagnosis Date Noted   Varicose veins of left lower extremity with inflammation 04/14/2018   Bilateral lower extremity edema 10/07/2017   History of total left knee replacement (TKR) 10/07/2017   Pura Spice, PT, DPT # [id number] Carlyle Basques, SPT 01/18/2020, 9:58 AM  [facility] [address] [place], [place], [address] Phone: [phone]   Fax:  [phone]  Name: BETTYLEE FEIG MRN: [id number] Date of Birth: 01/19/1966

## 2020-01-20 ENCOUNTER — Other Ambulatory Visit: Payer: Self-pay

## 2020-01-20 ENCOUNTER — Encounter: Payer: Self-pay | Admitting: Physical Therapy

## 2020-01-20 ENCOUNTER — Ambulatory Visit: Payer: BC Managed Care – PPO | Admitting: Physical Therapy

## 2020-01-20 DIAGNOSIS — M25661 Stiffness of right knee, not elsewhere classified: Secondary | ICD-10-CM

## 2020-01-20 DIAGNOSIS — R2689 Other abnormalities of gait and mobility: Secondary | ICD-10-CM

## 2020-01-20 DIAGNOSIS — Z96651 Presence of right artificial knee joint: Secondary | ICD-10-CM

## 2020-01-20 DIAGNOSIS — M6281 Muscle weakness (generalized): Secondary | ICD-10-CM

## 2020-01-20 NOTE — Therapy (Signed)
[facility] [address] [place], [place], [address] Phone: [phone]   Fax:  [phone]  Physical Therapy Treatment  Patient Details  Name: Grace Harris MRN: [id number] Date of Birth: September 22, 1965 Referring Provider (PT): Julian Hy, MD   Encounter Date: 01/20/2020   PT End of Session - 01/20/20 0830    Visit Number 14    Number of Visits 17    Date for PT Re-Evaluation 02/01/20    Authorization - Visit Number 6    Authorization - Number of Visits 10    PT Start Time 0825    PT Stop Time 0906    PT Time Calculation (min) 41 min    Activity Tolerance Patient tolerated treatment well    Behavior During Therapy [facility] for tasks assessed/performed           Past Medical History:  Diagnosis Date  . Anemia   . Arthritis     Past Surgical History:  Procedure Laterality Date  . CESAREAN SECTION     x 2  . JOINT REPLACEMENT Left 04/2017   @ [place] Left 05/28/2017   Procedure: CLOSED MANIPULATION KNEE;  Surgeon: Leanor Kail, MD;  Location: [facility] ORS;  Service: Orthopedics;  Laterality: Left;  Marland Kitchen VARICOSE VEIN SURGERY      There were no vitals filed for this visit.   Subjective Assessment - 01/20/20 0828    Subjective Pt. states that her R knee is 3/10 pain. Pt. states she went to the mall Tuesday afternoon.    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    Limitations Walking    How long can you stand comfortably? 15 minutes    How long can you walk comfortably? 10 mins    Patient Stated Goals Pt wants to get the most amount of motion back.    Currently in Pain? Yes    Pain Score 3     Pain Location Knee    Pain Orientation Right    Pain Descriptors / Indicators Aching             There Ex:  Nustep L2 seat position 5, 10 mins. Pt able to complete full available R knee flex on  bike, states she feels a good stretch. Pt monitored throughout to ensure no hip compensation   // bar squats: double hand on bar. 10x, cueing to improve R weightshift and to increase deep squat.  Total Gym: R knee flex able to achieve 102 deg. Completed approx 10 reps, pain too increased with deep squat holds  Supine R knee flex: PT assist holds to increase flexion. 5x 10-15 sec holds from therapist. Pt able to achieve 107 deg.  TRX squats: cued to weight R more (70 on R/30 on L) Pt able to complete 10x with good form.   Step ups with R leg: 10x onto step stool with bilat UE support, progressed to 12" step up with double arm support. Pt required cueing to improve R LE pushing, decreasing UE usage for power production.        PT Long Term Goals - 01/04/20 9518      PT LONG TERM GOAL #1   Title Pt. will improve R knee flexion AROM/PROM to at least 100 degrees to improve functional mobility.    Baseline 10/5: PROM: 73 deg; 11/2: PROM  106 deg, AROM 100 deg    Time 4    Period Weeks    Status Achieved    Target Date 01/04/20      PT LONG TERM GOAL #2   Title Pt. 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    Baseline 10/5: pt can only stand for about 15 mins before needing a seated rest break. 11/2: pt can still only stand about 15 mins    Time 4    Period Weeks    Status Partially Met    Target Date 02/01/20      PT LONG TERM GOAL #3   Title Pt. will increase R knee flex and ext strength by at least half a muscle grade to improve functional mobility.    Baseline 10/5: R knee ext: 4/5, R knee flex: 4+/5. 11/2: 5/5 R knee flex and ext.    Time 4    Period Weeks    Status Achieved    Target Date 01/04/20      PT LONG TERM GOAL #4   Title Pt. will be able to ambulate without AD and at most mild evidence of antalgic gait on the R to improve functional mobility.    Baseline 10/5: pt ambulates with moderate antalgic  gait on R LE with quad cane. 11/2: Pt ambulates with mild-moderate antalgic gait with no AD    Time 4    Period Weeks    Status Partially Met    Target Date 02/01/20      PT LONG TERM GOAL #5   Title Pt. will walk approx 800 feet during 6 MWT with no standing rest breaks to improve ability to return to work.    Baseline 11/2: 555 feet with one standing rest break    Time 4    Period Weeks    Status New    Target Date 02/01/20                 Plan - 01/20/20 0911    Clinical Impression Statement Pt. returns to therapy with slightly improved gait, still slight antalgic on R. Pt. still experiences pain in R knee with deep flexion, especially on total gym. With PT asisst, pt able to achieve 107 deg flex. Pt. still needs progressions of strength in R quad and hamstrings to progress ability to complete stairs. Pt.will benefit from further endurance training to return to work. Pt. will continue to benefit from skilled PT to improve AROM of R knee and strength/endurance to return to work    Personal Factors and Comorbidities Fitness;Past/Current Experience    Examination-Activity Limitations Bed Mobility;Lift;Squat;Stairs;Stand    Examination-Participation Restrictions Occupation    Stability/Clinical Decision Making Stable/Uncomplicated    Clinical Decision Making Low    Rehab Potential Good    PT Frequency 2x / week    PT Duration 4 weeks    PT Treatment/Interventions ADLs/Self Care Home Management;Cryotherapy;Therapeutic activities;Therapeutic exercise;Functional mobility training;Gait training;Balance training;Neuromuscular re-education;Patient/family education;Manual techniques;Scar mobilization;Passive range of motion;Stair training    PT Next Visit Plan Progress knee flexion, put together endurance program for gym, gait training    PT Home Exercise Plan [id number]    Consulted and Agree with Plan of Care Patient           Patient will benefit from skilled therapeutic  intervention in order to improve the following deficits and impairments:  Abnormal gait, Decreased activity tolerance, Decreased endurance, Decreased strength,  Difficulty walking, Impaired flexibility, Increased edema, Hypomobility, Increased fascial restricitons, Improper body mechanics, Postural dysfunction, Pain, Decreased mobility, Decreased range of motion  Visit Diagnosis: Status post right knee replacement  Decreased range of motion (ROM) of right knee  Other abnormalities of gait and mobility  Muscle weakness (generalized)     Problem List Patient Active Problem List   Diagnosis Date Noted  . Varicose veins of left lower extremity with inflammation 04/14/2018  . Bilateral lower extremity edema 10/07/2017  . History of total left knee replacement (TKR) 10/07/2017   Pura Spice, PT, DPT # [id number] 01/20/2020, 12:23 PM  [facility] [address] [place], [place], [address] Phone: [phone]   Fax:  [phone]  Name: Grace Harris MRN: [id number] Date of Birth: [birth year]

## 2020-01-25 ENCOUNTER — Ambulatory Visit: Payer: BC Managed Care – PPO | Admitting: Physical Therapy

## 2020-01-25 ENCOUNTER — Encounter: Payer: Self-pay | Admitting: Physical Therapy

## 2020-01-25 ENCOUNTER — Other Ambulatory Visit: Payer: Self-pay

## 2020-01-25 DIAGNOSIS — M6281 Muscle weakness (generalized): Secondary | ICD-10-CM

## 2020-01-25 DIAGNOSIS — Z96651 Presence of right artificial knee joint: Secondary | ICD-10-CM | POA: Diagnosis not present

## 2020-01-25 DIAGNOSIS — R2689 Other abnormalities of gait and mobility: Secondary | ICD-10-CM

## 2020-01-25 DIAGNOSIS — M25661 Stiffness of right knee, not elsewhere classified: Secondary | ICD-10-CM

## 2020-01-25 NOTE — Therapy (Signed)
[facility] [address] [place], [place], [address] Phone: [phone]   Fax:  [phone]  Physical Therapy Treatment  Patient Details  Name: Grace Harris MRN: [id number] Date of Birth: 03-23-1965 Referring Provider (PT): Julian Hy, MD   Encounter Date: 01/25/2020   PT End of Session - 01/25/20 0835    Visit Number 15    Number of Visits 17    Date for PT Re-Evaluation 02/01/20    Authorization - Visit Number 7    Authorization - Number of Visits 10    PT Start Time 0819    PT Stop Time 0905    PT Time Calculation (min) 46 min    Activity Tolerance Patient tolerated treatment well    Behavior During Therapy [facility] for tasks assessed/performed           Past Medical History:  Diagnosis Date  . Anemia   . Arthritis     Past Surgical History:  Procedure Laterality Date  . CESAREAN SECTION     x 2  . JOINT REPLACEMENT Left 04/2017   @ [place] Left 05/28/2017   Procedure: CLOSED MANIPULATION KNEE;  Surgeon: Leanor Kail, MD;  Location: [facility] ORS;  Service: Orthopedics;  Laterality: Left;  Marland Kitchen VARICOSE VEIN SURGERY      There were no vitals filed for this visit.   Subjective Assessment - 01/25/20 0943    Subjective Pt. entered PT with c/o tightness and discomfort in R lateral aspect of distal quad/ prox. tibia.  Pt. entered PT with marked antalgic gait pattern.    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    Limitations Walking    How long can you stand comfortably? 15 minutes    How long can you walk comfortably? 10 mins    Patient Stated Goals Pt wants to get the most amount of motion back.    Currently in Pain? Yes    Pain Score 3     Pain Location Knee    Pain Orientation Right             There Ex:  Nustep L4 seat position 5, 10 mins.  Pt monitored throughout to  ensure no hip compensation   Seated L knee flexion A/AROM with holds.  Seated L LAQ with knee extension (hamstring stretch)  Walking in //-bars forward/ backwards working on more consistent gait pattern.  Pt. Limited by decrease R LE stance phase of gait.   Discussed   Manual tx.:  Supine R knee flex: PT assist holds to increase flexion. 5x 10-15 sec holds from therapist. Pt able to achieve 107 deg. (pain limited).    Supine patellar mobs. All planes.  (good mobility).    Supine R proximal tibia AP grade III mobs. 3x 30 sec./  STM to R distal quad and proximal tibia.           PT Long Term Goals - 01/04/20 0102      PT LONG TERM GOAL #1   Title Pt. will improve R knee flexion AROM/PROM to at least 100 degrees to improve functional mobility.    Baseline 10/5: PROM: 73 deg; 11/2: PROM 106 deg, AROM 100 deg    Time 4    Period Weeks    Status Achieved    Target Date 01/04/20  PT LONG TERM GOAL #2   Title Pt. 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    Baseline 10/5: pt can only stand for about 15 mins before needing a seated rest break. 11/2: pt can still only stand about 15 mins    Time 4    Period Weeks    Status Partially Met    Target Date 02/01/20      PT LONG TERM GOAL #3   Title Pt. will increase R knee flex and ext strength by at least half a muscle grade to improve functional mobility.    Baseline 10/5: R knee ext: 4/5, R knee flex: 4+/5. 11/2: 5/5 R knee flex and ext.    Time 4    Period Weeks    Status Achieved    Target Date 01/04/20      PT LONG TERM GOAL #4   Title Pt. will be able to ambulate without AD and at most mild evidence of antalgic gait on the R to improve functional mobility.    Baseline 10/5: pt ambulates with moderate antalgic gait on R LE with quad cane. 11/2: Pt ambulates with mild-moderate antalgic gait with no AD    Time 4    Period Weeks    Status Partially  Met    Target Date 02/01/20      PT LONG TERM GOAL #5   Title Pt. will walk approx 800 feet during 6 MWT with no standing rest breaks to improve ability to return to work.    Baseline 11/2: 555 feet with one standing rest break    Time 4    Period Weeks    Status New    Target Date 02/01/20               Plan - 01/25/20 0944    Clinical Impression Statement Pt. able to correct R antalgic gait pattern with verbal cuing and assist of mirror for feedback.  Pts. gait limited by decrease R LE stance phase of gait due to discomfort/ chronic condition.  R knee flexion remains limited to 107 deg. in seated/ supine flexion.  Good patellar mobility (all planes).  No changes to HEP at this time.  Pt. instructed to focus on gait pattern this weekend.    Personal Factors and Comorbidities Fitness;Past/Current Experience    Examination-Activity Limitations Bed Mobility;Lift;Squat;Stairs;Stand    Examination-Participation Restrictions Occupation    Stability/Clinical Decision Making Stable/Uncomplicated    Rehab Potential Good    PT Frequency 2x / week    PT Duration 4 weeks    PT Treatment/Interventions ADLs/Self Care Home Management;Cryotherapy;Therapeutic activities;Therapeutic exercise;Functional mobility training;Gait training;Balance training;Neuromuscular re-education;Patient/family education;Manual techniques;Scar mobilization;Passive range of motion;Stair training    PT Next Visit Plan Progress knee flexion, put together endurance program for gym, gait training    PT Home Exercise Plan [id number]    Consulted and Agree with Plan of Care Patient           Patient will benefit from skilled therapeutic intervention in order to improve the following deficits and impairments:  Abnormal gait, Decreased activity tolerance, Decreased endurance, Decreased strength, Difficulty walking, Impaired flexibility, Increased edema, Hypomobility, Increased fascial restricitons, Improper body mechanics,  Postural dysfunction, Pain, Decreased mobility, Decreased range of motion  Visit Diagnosis: Status post right knee replacement  Decreased range of motion (ROM) of right knee  Other abnormalities of gait and mobility  Muscle weakness (generalized)  Problem List Patient Active Problem List   Diagnosis Date Noted  . Varicose veins of left lower extremity with inflammation 04/14/2018  . Bilateral lower extremity edema 10/07/2017  . History of total left knee replacement (TKR) 10/07/2017   Pura Spice, PT, DPT # [id number] 01/25/2020, 9:49 AM  [facility] [address] [place], [place], [address] Phone: [phone]   Fax:  [phone]  Name: DANYAH GUASTELLA MRN: [id number] Date of Birth: 1965-04-14

## 2020-02-01 ENCOUNTER — Encounter: Payer: Self-pay | Admitting: Physical Therapy

## 2020-02-01 ENCOUNTER — Ambulatory Visit: Payer: BC Managed Care – PPO | Admitting: Physical Therapy

## 2020-02-01 ENCOUNTER — Other Ambulatory Visit: Payer: Self-pay

## 2020-02-01 DIAGNOSIS — M25661 Stiffness of right knee, not elsewhere classified: Secondary | ICD-10-CM

## 2020-02-01 DIAGNOSIS — Z96651 Presence of right artificial knee joint: Secondary | ICD-10-CM | POA: Diagnosis not present

## 2020-02-01 DIAGNOSIS — R2689 Other abnormalities of gait and mobility: Secondary | ICD-10-CM

## 2020-02-01 DIAGNOSIS — M6281 Muscle weakness (generalized): Secondary | ICD-10-CM

## 2020-02-01 NOTE — Therapy (Signed)
[facility] [address] [place], [place], [address] Phone: [phone]   Fax:  [phone]  Physical Therapy Treatment  Patient Details  Name: Grace Harris MRN: [id number] Date of Birth: [birth year] Referring Provider (PT): Julian Hy, MD   Encounter Date: 02/01/2020   PT End of Session - 02/01/20 0827    Visit Number 16    Number of Visits 17    Date for PT Re-Evaluation 02/01/20    Authorization - Visit Number 8    Authorization - Number of Visits 10    PT Start Time 0818    PT Stop Time 0904    PT Time Calculation (min) 46 min    Activity Tolerance Patient tolerated treatment well    Behavior During Therapy [facility] for tasks assessed/performed           Past Medical History:  Diagnosis Date  . Anemia   . Arthritis     Past Surgical History:  Procedure Laterality Date  . CESAREAN SECTION     x 2  . JOINT REPLACEMENT Left 04/2017   @ [place] Left 05/28/2017   Procedure: CLOSED MANIPULATION KNEE;  Surgeon: Leanor Kail, MD;  Location: [facility] ORS;  Service: Orthopedics;  Laterality: Left;  Marland Kitchen VARICOSE VEIN SURGERY      There were no vitals filed for this visit.   Subjective Assessment - 02/01/20 0824    Subjective Pt. had f/u with Dr. Francia Greaves and arrived to PT with new MD order.  Pt. is scheduled to return to work on 03/06/2020.    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    Limitations Walking    How long can you stand comfortably? 15 minutes    How long can you walk comfortably? 10 mins    Patient Stated Goals Pt wants to get the most amount of motion back.    Currently in Pain? No/denies               There Ex:  Nustep L4 seat position 5, 10 mins.  Pt monitored throughout to ensure no hip compensation   Walking in //-bars forward/ lateral directions working on more  consistent gait pattern 3x each Personal assistant).  Cuing to maintain knee flexion/ quad control.   Seated L knee flexion A/AROM with holds.  Seated L LAQ with knee extension (hamstring stretch)  TRX squats: using low stool as a marker  25x  TM walking at 1.2 mph for 5 min. Level surface with focus on consistent step pattern/ heel strike.    Manual tx.:  Supine R knee flex: PT assist holds to increase flexion. 5x 10-15 sec holds from therapist. Pt able to achieve 110 deg. (pain limited).    Supine patellar mobs. All planes.  (good mobility).    Supine R proximal tibia AP grade III mobs. 3x 30 sec./  STM to R distal quad and proximal tibia.          PT Long Term Goals - 01/04/20 9767      PT LONG TERM GOAL #1   Title Pt. will improve R knee flexion AROM/PROM to at least 100 degrees to improve functional mobility.    Baseline 10/5: PROM: 73 deg; 11/2: PROM 106 deg, AROM 100 deg    Time 4    Period Weeks    Status  Achieved    Target Date 01/04/20      PT LONG TERM GOAL #2   Title Pt. 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    Baseline 10/5: pt can only stand for about 15 mins before needing a seated rest break. 11/2: pt can still only stand about 15 mins    Time 4    Period Weeks    Status Partially Met    Target Date 02/01/20      PT LONG TERM GOAL #3   Title Pt. will increase R knee flex and ext strength by at least half a muscle grade to improve functional mobility.    Baseline 10/5: R knee ext: 4/5, R knee flex: 4+/5. 11/2: 5/5 R knee flex and ext.    Time 4    Period Weeks    Status Achieved    Target Date 01/04/20      PT LONG TERM GOAL #4   Title Pt. will be able to ambulate without AD and at most mild evidence of antalgic gait on the R to improve functional mobility.    Baseline 10/5: pt ambulates with moderate antalgic gait on R LE with quad cane. 11/2: Pt ambulates with mild-moderate  antalgic gait with no AD    Time 4    Period Weeks    Status Partially Met    Target Date 02/01/20      PT LONG TERM GOAL #5   Title Pt. will walk approx 800 feet during 6 MWT with no standing rest breaks to improve ability to return to work.    Baseline 11/2: 555 feet with one standing rest break    Time 4    Period Weeks    Status New    Target Date 02/01/20                 Plan - 02/01/20 0827    Clinical Impression Statement PT focused on correcting antalgic gait pattern in //-bars and TM.  Pt. has decrease stance time on R with increase L swing through phase of gait resulting in antalgic gait pattern.  Pt. pain limited with supine L knee AA/PROM flexion to 110 deg.  Pt. presents with good R knee extension.    Personal Factors and Comorbidities Fitness;Past/Current Experience    Examination-Activity Limitations Bed Mobility;Lift;Squat;Stairs;Stand    Examination-Participation Restrictions Occupation    Stability/Clinical Decision Making Stable/Uncomplicated    Clinical Decision Making Low    Rehab Potential Good    PT Frequency 2x / week    PT Duration 4 weeks    PT Treatment/Interventions ADLs/Self Care Home Management;Cryotherapy;Therapeutic activities;Therapeutic exercise;Functional mobility training;Gait training;Balance training;Neuromuscular re-education;Patient/family education;Manual techniques;Scar mobilization;Passive range of motion;Stair training    PT Next Visit Plan Progress knee flexion, put together endurance program for gym, gait training.  Recert next tx.    PT Home Exercise Plan [id number]    Consulted and Agree with Plan of Care Patient           Patient will benefit from skilled therapeutic intervention in order to improve the following deficits and impairments:  Abnormal gait, Decreased activity tolerance, Decreased endurance, Decreased strength, Difficulty walking, Impaired flexibility, Increased edema, Hypomobility, Increased fascial restricitons,  Improper body mechanics, Postural dysfunction, Pain, Decreased mobility, Decreased range of motion  Visit Diagnosis: Status post right knee replacement  Decreased range of motion (ROM) of right knee  Other abnormalities of gait and mobility  Muscle weakness (generalized)     Problem List Patient Active Problem List   Diagnosis Date Noted  . Varicose veins of left lower extremity with inflammation 04/14/2018  . Bilateral lower extremity edema 10/07/2017  . History of total left knee replacement (TKR) 10/07/2017   Pura Spice, PT, DPT # [id number] 02/01/2020, 10:33 AM  [facility] [address] [place], [place], [address] Phone: [phone]   Fax:  [phone]  Name: Grace Harris MRN: [id number] Date of Birth: [birth year]

## 2020-02-03 ENCOUNTER — Ambulatory Visit: Payer: BC Managed Care – PPO | Attending: Orthopaedic Surgery | Admitting: Physical Therapy

## 2020-02-03 ENCOUNTER — Other Ambulatory Visit: Payer: Self-pay

## 2020-02-03 DIAGNOSIS — M25661 Stiffness of right knee, not elsewhere classified: Secondary | ICD-10-CM | POA: Insufficient documentation

## 2020-02-03 DIAGNOSIS — M6281 Muscle weakness (generalized): Secondary | ICD-10-CM | POA: Diagnosis present

## 2020-02-03 DIAGNOSIS — Z96651 Presence of right artificial knee joint: Secondary | ICD-10-CM | POA: Diagnosis present

## 2020-02-03 DIAGNOSIS — R2689 Other abnormalities of gait and mobility: Secondary | ICD-10-CM | POA: Insufficient documentation

## 2020-02-04 ENCOUNTER — Encounter: Payer: Self-pay | Admitting: Physical Therapy

## 2020-02-04 NOTE — Therapy (Signed)
[facility] [address] [place], [place], [address] Phone: [phone]   Fax:  [phone]  Physical Therapy Treatment  Patient Details  Name: Grace Harris MRN: [id number] Date of Birth: [birth year] Referring Provider (PT): Julian Hy, MD   Encounter Date: 02/03/2020   PT End of Session - 02/04/20 1256    Visit Number 17    Number of Visits 25    Date for PT Re-Evaluation 03/02/20    Authorization - Visit Number 1    Authorization - Number of Visits 10    PT Start Time 0824    PT Stop Time 0914    PT Time Calculation (min) 50 min    Activity Tolerance Patient tolerated treatment well;Patient limited by pain    Behavior During Therapy [facility] for tasks assessed/performed           Past Medical History:  Diagnosis Date  . Anemia   . Arthritis     Past Surgical History:  Procedure Laterality Date  . CESAREAN SECTION     x 2  . JOINT REPLACEMENT Left 04/2017   @ [place] Left 05/28/2017   Procedure: CLOSED MANIPULATION KNEE;  Surgeon: Leanor Kail, MD;  Location: [facility] ORS;  Service: Orthopedics;  Laterality: Left;  Marland Kitchen VARICOSE VEIN SURGERY      There were no vitals filed for this visit.   Subjective Assessment - 02/04/20 1253    Subjective Pt. reports no new compliants.  Pt. continues to focus on R knee flexion and normalizing gait before returning to work.    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    Limitations Walking    How long can you stand comfortably? 15 minutes    How long can you walk comfortably? 10 mins    Patient Stated Goals Pt wants to get the most amount of motion back.    Currently in Pain? No/denies              [facility] PT Assessment - 02/04/20 0001      Assessment   Medical Diagnosis S/P R TKA    Referring Provider (PT) Julian Hy, MD    Onset  Date/Surgical Date 11/16/19      Home Environment   Living Environment Private residence      Prior Function   Level of Independence Independent           There.ex.:  Nustep L5 10 min. (warm-up)- knee flexion focus. Hallway walking with cuing to decrease cadence of L swing through phase of gait/ increase knee flexion/ heel strike //-bars: high marching forward/ backwards walking/ lateral walking (upright posture) 6" step ups/ downs BOSU lunges with static holds 5x on L/R TRX squats with use of box 20x Supine isometrics for R hip/knee ex 5x each with holds (moderate resistance)  Manual tx.  Supine R knee flexion/ grade III AP mobs. At proximal tibia 4x30 sec. STM to L distal quad/ knee Patellar mobs. (all planes)      PT Long Term Goals - 02/04/20 1301      PT LONG TERM GOAL #1   Title Pt. will improve R knee flexion AROM/PROM to at least 100 degrees to improve functional mobility.    Baseline 10/5: PROM: 73 deg; 11/2: PROM 106 deg, AROM 100 deg    Time 4  Period Weeks    Status Achieved    Target Date 01/04/20      PT LONG TERM GOAL #2   Title Pt. 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    Baseline 10/5: pt can only stand for about 15 mins before needing a seated rest break. 11/2: pt can still only stand about 15 mins    Time 4    Period Weeks    Status Achieved    Target Date 02/03/20      PT LONG TERM GOAL #3   Title Pt. will increase R knee flex and ext strength by at least half a muscle grade to improve functional mobility.    Baseline 10/5: R knee ext: 4/5, R knee flex: 4+/5. 11/2: 5/5 R knee flex and ext.    Time 4    Period Weeks    Status Achieved    Target Date 01/04/20      PT LONG TERM GOAL #4   Title Pt. will be able to ambulate without AD and at most mild evidence of antalgic gait on the R to improve functional mobility.    Baseline 10/5: pt ambulates with moderate antalgic  gait on R LE with quad cane. 11/2: Pt ambulates with mild-moderate antalgic gait with no AD    Time 4    Period Weeks    Status Partially Met    Target Date 03/02/20      PT LONG TERM GOAL #5   Title Pt. will walk approx 800 feet during 6 MWT with no standing rest breaks to improve ability to return to work.    Baseline 11/2: 555 feet with one standing rest break    Time 4    Period Weeks    Status On-going    Target Date 03/02/20      Additional Long Term Goals   Additional Long Term Goals Yes      PT LONG TERM GOAL #6   Title Pt. will demonstrtate >110 deg. R knee flexion in supine position with no assist to improve gait/ stair climbing.    Baseline Pain limited R knee flexion    Time 4    Period Weeks    Status New    Target Date 03/02/20                 Plan - 02/04/20 1258    Clinical Impression Statement Pt. continues to show slow but consistent progress with L knee flexion and gait pattern.  See updated goals.  Pt. has continued to demonstrate improvements in R knee flexion and in gait, continues to demonstrates slight antalgic gait on R.  Pt. able to demonstrate 110 deg. R knee flexion after stretching/ manual therapy.  Pt. will continue to benefit from skilled PT to improve endurance, strength, and AROM of R knee to improve ability to return to work.    Personal Factors and Comorbidities Fitness;Past/Current Experience    Examination-Activity Limitations Bed Mobility;Lift;Squat;Stairs;Stand    Examination-Participation Restrictions Occupation    Stability/Clinical Decision Making Stable/Uncomplicated    Clinical Decision Making Low    Rehab Potential Good    PT Frequency 2x / week    PT Duration 4 weeks    PT Treatment/Interventions ADLs/Self Care Home Management;Cryotherapy;Therapeutic activities;Therapeutic exercise;Functional mobility training;Gait training;Balance training;Neuromuscular re-education;Patient/family education;Manual techniques;Scar  mobilization;Passive range of motion;Stair training    PT Next Visit Plan Focus on normalizing gait/ increasing knee  flexion.    PT Home Exercise Plan [id number]    Consulted and Agree with Plan of Care Patient           Patient will benefit from skilled therapeutic intervention in order to improve the following deficits and impairments:  Abnormal gait, Decreased activity tolerance, Decreased endurance, Decreased strength, Difficulty walking, Impaired flexibility, Increased edema, Hypomobility, Increased fascial restricitons, Improper body mechanics, Postural dysfunction, Pain, Decreased mobility, Decreased range of motion  Visit Diagnosis: Status post right knee replacement  Decreased range of motion (ROM) of right knee  Other abnormalities of gait and mobility  Muscle weakness (generalized)     Problem List Patient Active Problem List   Diagnosis Date Noted  . Varicose veins of left lower extremity with inflammation 04/14/2018  . Bilateral lower extremity edema 10/07/2017  . History of total left knee replacement (TKR) 10/07/2017   Pura Spice, PT, DPT # [id number] 02/04/2020, 1:05 PM  [facility] [address] [place], [place], [address] Phone: [phone]   Fax:  [phone]  Name: Grace Harris MRN: [id number] Date of Birth: 17-May-1965

## 2020-02-08 ENCOUNTER — Encounter: Payer: Self-pay | Admitting: Physical Therapy

## 2020-02-08 ENCOUNTER — Ambulatory Visit: Payer: BC Managed Care – PPO | Admitting: Physical Therapy

## 2020-02-08 ENCOUNTER — Other Ambulatory Visit: Payer: Self-pay

## 2020-02-08 DIAGNOSIS — M6281 Muscle weakness (generalized): Secondary | ICD-10-CM

## 2020-02-08 DIAGNOSIS — Z96651 Presence of right artificial knee joint: Secondary | ICD-10-CM | POA: Diagnosis not present

## 2020-02-08 DIAGNOSIS — M25661 Stiffness of right knee, not elsewhere classified: Secondary | ICD-10-CM

## 2020-02-08 DIAGNOSIS — R2689 Other abnormalities of gait and mobility: Secondary | ICD-10-CM

## 2020-02-08 NOTE — Therapy (Signed)
[facility] [address] [place], [place], [address] Phone: [phone]   Fax:  [phone]  Physical Therapy Treatment  Patient Details  Name: Grace Harris MRN: [id number] Date of Birth: 28-Mar-1965 Referring Provider (PT): Julian Hy, MD   Encounter Date: 02/08/2020   PT End of Session - 02/08/20 0839    Visit Number 18    Number of Visits 25    Date for PT Re-Evaluation 03/02/20    Authorization - Visit Number 2    Authorization - Number of Visits 10    PT Start Time 0813    PT Stop Time 0902    PT Time Calculation (min) 49 min    Activity Tolerance Patient tolerated treatment well;Patient limited by pain    Behavior During Therapy [facility] for tasks assessed/performed           Past Medical History:  Diagnosis Date  . Anemia   . Arthritis     Past Surgical History:  Procedure Laterality Date  . CESAREAN SECTION     x 2  . JOINT REPLACEMENT Left 04/2017   @ [place] Left 05/28/2017   Procedure: CLOSED MANIPULATION KNEE;  Surgeon: Leanor Kail, MD;  Location: [facility] ORS;  Service: Orthopedics;  Laterality: Left;  Marland Kitchen VARICOSE VEIN SURGERY      There were no vitals filed for this visit.   Subjective Assessment - 02/08/20 0907    Subjective Pt. states she experienced some sciatic symptoms the other day but no reports of pain today in back/hip.  Pt. states she is "stiff" in the R knee this morning.    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    Limitations Walking    How long can you stand comfortably? 15 minutes    How long can you walk comfortably? 10 mins    Patient Stated Goals Pt wants to get the most amount of motion back.    Currently in Pain? No/denies            There.ex.:  Nustep L 4 10 min. B LE (warm-up/ discussed activities over past weekend).    Walking in clinic/  //-bars with focus on consistent step pattern and heel strike.  High marching/ knee flexion in //-bars  Nautilus: 80# resisted gait all 4-planes 3x each.  Fatigue noted/ good balance.  TRX knee flexion 10x2  BOSU step ups/ lunges 5x each with static holds  Supine R knee flexion (static holds)- 110 deg.      PT Long Term Goals - 02/04/20 1301      PT LONG TERM GOAL #1   Title Pt. will improve R knee flexion AROM/PROM to at least 100 degrees to improve functional mobility.    Baseline 10/5: PROM: 73 deg; 11/2: PROM 106 deg, AROM 100 deg    Time 4    Period Weeks    Status Achieved    Target Date 01/04/20      PT LONG TERM GOAL #2   Title Pt. 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    Baseline 10/5: pt can only stand for about 15 mins before needing a seated rest break. 11/2: pt can still only stand about 15 mins    Time 4    Period Weeks  Status Achieved    Target Date 02/03/20      PT LONG TERM GOAL #3   Title Pt. will increase R knee flex and ext strength by at least half a muscle grade to improve functional mobility.    Baseline 10/5: R knee ext: 4/5, R knee flex: 4+/5. 11/2: 5/5 R knee flex and ext.    Time 4    Period Weeks    Status Achieved    Target Date 01/04/20      PT LONG TERM GOAL #4   Title Pt. will be able to ambulate without AD and at most mild evidence of antalgic gait on the R to improve functional mobility.    Baseline 10/5: pt ambulates with moderate antalgic gait on R LE with quad cane. 11/2: Pt ambulates with mild-moderate antalgic gait with no AD    Time 4    Period Weeks    Status Partially Met    Target Date 03/02/20      PT LONG TERM GOAL #5   Title Pt. will walk approx 800 feet during 6 MWT with no standing rest breaks to improve ability to return to work.    Baseline 11/2: 555 feet with one standing rest break    Time 4    Period Weeks    Status On-going    Target  Date 03/02/20      Additional Long Term Goals   Additional Long Term Goals Yes      PT LONG TERM GOAL #6   Title Pt. will demonstrtate >110 deg. R knee flexion in supine position with no assist to improve gait/ stair climbing.    Baseline Pain limited R knee flexion    Time 4    Period Weeks    Status New    Target Date 03/02/20              Plan - 02/08/20 5681    Clinical Impression Statement Pt. ambulates with stiff R antalgic gait prior to ther.ex./ manual tx.  Pt. able to correct gait pattern in //-bars and with verbal cuing/ feedback.  R knee flexion AROM 110 deg. and PROM 112 deg. (pain).  No change to HEP and pt. instructed to focus on R knee flexion/ walking endurance.    Personal Factors and Comorbidities Fitness;Past/Current Experience    Examination-Activity Limitations Bed Mobility;Lift;Squat;Stairs;Stand    Examination-Participation Restrictions Occupation    Stability/Clinical Decision Making Stable/Uncomplicated    Clinical Decision Making Low    Rehab Potential Good    PT Frequency 2x / week    PT Duration 4 weeks    PT Treatment/Interventions ADLs/Self Care Home Management;Cryotherapy;Therapeutic activities;Therapeutic exercise;Functional mobility training;Gait training;Balance training;Neuromuscular re-education;Patient/family education;Manual techniques;Scar mobilization;Passive range of motion;Stair training    PT Next Visit Plan Focus on normalizing gait/ increasing knee flexion.    PT Home Exercise Plan [id number]    Consulted and Agree with Plan of Care Patient           Patient will benefit from skilled therapeutic intervention in order to improve the following deficits and impairments:  Abnormal gait, Decreased activity tolerance, Decreased endurance, Decreased strength, Difficulty walking, Impaired flexibility, Increased edema, Hypomobility, Increased fascial restricitons, Improper body mechanics, Postural dysfunction, Pain, Decreased mobility, Decreased  range of motion  Visit Diagnosis: Status post right knee replacement  Decreased range of motion (ROM) of right knee  Other abnormalities of gait and mobility  Muscle weakness (generalized)     Problem List Patient Active Problem List  Diagnosis Date Noted  . Varicose veins of left lower extremity with inflammation 04/14/2018  . Bilateral lower extremity edema 10/07/2017  . History of total left knee replacement (TKR) 10/07/2017   Pura Spice, PT, DPT # [id number] 02/08/2020, 9:30 AM  [facility] [address] [place], [place], [address] Phone: [phone]   Fax:  [phone]  Name: Grace Harris MRN: [id number] Date of Birth: 04-10-1965

## 2020-02-10 ENCOUNTER — Ambulatory Visit: Payer: BC Managed Care – PPO | Admitting: Physical Therapy

## 2020-02-14 ENCOUNTER — Other Ambulatory Visit: Payer: Self-pay

## 2020-02-14 ENCOUNTER — Encounter: Payer: Self-pay | Admitting: Physical Therapy

## 2020-02-14 ENCOUNTER — Ambulatory Visit: Payer: BC Managed Care – PPO | Admitting: Physical Therapy

## 2020-02-14 DIAGNOSIS — Z96651 Presence of right artificial knee joint: Secondary | ICD-10-CM

## 2020-02-14 DIAGNOSIS — R2689 Other abnormalities of gait and mobility: Secondary | ICD-10-CM

## 2020-02-14 DIAGNOSIS — M25661 Stiffness of right knee, not elsewhere classified: Secondary | ICD-10-CM

## 2020-02-14 DIAGNOSIS — M6281 Muscle weakness (generalized): Secondary | ICD-10-CM

## 2020-02-14 NOTE — Therapy (Signed)
[place] [facility] [address] [place], [place], [address] Phone: [phone]   Fax:  [phone]  Physical Therapy Treatment  Patient Details  Name: Grace Harris MRN: [id number] Date of Birth: 10/04/1965 Referring Provider (PT): Julian Hy, MD   Encounter Date: 02/14/2020   PT End of Session - 02/14/20 0740    Visit Number 19    Number of Visits 25    Date for PT Re-Evaluation 03/02/20    Authorization - Visit Number 3    Authorization - Number of Visits 10    PT Start Time 0732    PT Stop Time 0820    PT Time Calculation (min) 48 min    Activity Tolerance Patient tolerated treatment well;Patient limited by pain    Behavior During Therapy [facility] for tasks assessed/performed           Past Medical History:  Diagnosis Date  . Anemia   . Arthritis     Past Surgical History:  Procedure Laterality Date  . CESAREAN SECTION     x 2  . JOINT REPLACEMENT Left 04/2017   @ [place] Left 05/28/2017   Procedure: CLOSED MANIPULATION KNEE;  Surgeon: Leanor Kail, MD;  Location: [facility] ORS;  Service: Orthopedics;  Laterality: Left;  Marland Kitchen VARICOSE VEIN SURGERY      There were no vitals filed for this visit.   Subjective Assessment - 02/14/20 0733    Subjective Pt. had a great birthday weekend.  Pt. reports no knee pain this morning.    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    Limitations Walking    How long can you stand comfortably? 15 minutes    How long can you walk comfortably? 10 mins    Patient Stated Goals Pt wants to get the most amount of motion back.    Currently in Pain? No/denies            There.ex.:  Nustep L 4 10 min. B LE (warm-up/ discussed activities over birthday weekend).    Walking in clinic/ //-bars with focus on consistent step pattern and heel strike.  High marching/  knee flexion in //-bars. Lateral walking with partial squat.    Sit to stands from gray chair with mirror feedback 10x2.    Nautilus: 80# resisted gait all 4-planes 5x each.  Fatigue noted/ good balance.  Supine R hip/knee manual isometrics 5x (moderate to max. Resistance)  Supine R knee flexion (static holds)- 114 deg.      PT Long Term Goals - 02/04/20 1301      PT LONG TERM GOAL #1   Title Pt. will improve R knee flexion AROM/PROM to at least 100 degrees to improve functional mobility.    Baseline 10/5: PROM: 73 deg; 11/2: PROM 106 deg, AROM 100 deg    Time 4    Period Weeks    Status Achieved    Target Date 01/04/20      PT LONG TERM GOAL #2   Title Pt. 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    Baseline 10/5: pt can only stand for about 15 mins before needing a seated rest break. 11/2: pt can still only stand about 15 mins    Time 4    Period Weeks  Status Achieved    Target Date 02/03/20      PT LONG TERM GOAL #3   Title Pt. will increase R knee flex and ext strength by at least half a muscle grade to improve functional mobility.    Baseline 10/5: R knee ext: 4/5, R knee flex: 4+/5. 11/2: 5/5 R knee flex and ext.    Time 4    Period Weeks    Status Achieved    Target Date 01/04/20      PT LONG TERM GOAL #4   Title Pt. will be able to ambulate without AD and at most mild evidence of antalgic gait on the R to improve functional mobility.    Baseline 10/5: pt ambulates with moderate antalgic gait on R LE with quad cane. 11/2: Pt ambulates with mild-moderate antalgic gait with no AD    Time 4    Period Weeks    Status Partially Met    Target Date 03/02/20      PT LONG TERM GOAL #5   Title Pt. will walk approx 800 feet during 6 MWT with no standing rest breaks to improve ability to return to work.    Baseline 11/2: 555 feet with one standing rest break    Time 4    Period Weeks    Status  On-going    Target Date 03/02/20      Additional Long Term Goals   Additional Long Term Goals Yes      PT LONG TERM GOAL #6   Title Pt. will demonstrtate >110 deg. R knee flexion in supine position with no assist to improve gait/ stair climbing.    Baseline Pain limited R knee flexion    Time 4    Period Weeks    Status New    Target Date 03/02/20                 Plan - 02/14/20 0740    Clinical Impression Statement Pt. entered PT with slight lateral R antalgic gait due to decrease stance time on R.  Pt. able to correct gait pattern with cuing/ mirror feedback.  Good LE muscle endurance with standing resisted/ gait activites.  Pt. continues to make consistent progress with R knee flexion and generalized LE strengthening.    Personal Factors and Comorbidities Fitness;Past/Current Experience    Examination-Activity Limitations Bed Mobility;Lift;Squat;Stairs;Stand    Examination-Participation Restrictions Occupation    Stability/Clinical Decision Making Stable/Uncomplicated    Clinical Decision Making Low    Rehab Potential Good    PT Frequency 2x / week    PT Duration 4 weeks    PT Treatment/Interventions ADLs/Self Care Home Management;Cryotherapy;Therapeutic activities;Therapeutic exercise;Functional mobility training;Gait training;Balance training;Neuromuscular re-education;Patient/family education;Manual techniques;Scar mobilization;Passive range of motion;Stair training    PT Next Visit Plan Focus on normalizing gait/ increasing knee flexion.    PT Home Exercise Plan [id number]    Consulted and Agree with Plan of Care Patient           Patient will benefit from skilled therapeutic intervention in order to improve the following deficits and impairments:  Abnormal gait,Decreased activity tolerance,Decreased endurance,Decreased strength,Difficulty walking,Impaired flexibility,Increased edema,Hypomobility,Increased fascial restricitons,Improper body mechanics,Postural  dysfunction,Pain,Decreased mobility,Decreased range of motion  Visit Diagnosis: Status post right knee replacement  Decreased range of motion (ROM) of right knee  Other abnormalities of gait and mobility  Muscle weakness (generalized)     Problem List Patient Active Problem List   Diagnosis Date Noted  . Varicose veins of left lower  extremity with inflammation 04/14/2018  . Bilateral lower extremity edema 10/07/2017  . History of total left knee replacement (TKR) 10/07/2017   Pura Spice, PT, DPT # [id number] 02/14/2020, 10:19 AM  [place] [facility] [address] [place], [place], [address] Phone: [phone]   Fax:  [phone]  Name: KEESHIA SANDERLIN MRN: [id number] Date of Birth: 1965-12-04

## 2020-02-16 ENCOUNTER — Ambulatory Visit: Payer: BC Managed Care – PPO | Admitting: Physical Therapy

## 2020-02-16 ENCOUNTER — Encounter: Payer: Self-pay | Admitting: Physical Therapy

## 2020-02-16 ENCOUNTER — Other Ambulatory Visit: Payer: Self-pay

## 2020-02-16 DIAGNOSIS — Z96651 Presence of right artificial knee joint: Secondary | ICD-10-CM

## 2020-02-16 DIAGNOSIS — M6281 Muscle weakness (generalized): Secondary | ICD-10-CM

## 2020-02-16 DIAGNOSIS — R2689 Other abnormalities of gait and mobility: Secondary | ICD-10-CM

## 2020-02-16 DIAGNOSIS — M25661 Stiffness of right knee, not elsewhere classified: Secondary | ICD-10-CM

## 2020-02-16 NOTE — Therapy (Signed)
[facility] [address] [place], [place], [address] Phone: [phone]   Fax:  [phone]  Physical Therapy Treatment  Patient Details  Name: Grace Harris MRN: [id number] Date of Birth: 1965/06/29 Referring Provider (PT): Julian Hy, MD   Encounter Date: 02/16/2020   PT End of Session - 02/16/20 0735    Visit Number 20    Number of Visits 25    Date for PT Re-Evaluation 03/02/20    Authorization - Visit Number 4    Authorization - Number of Visits 10    PT Start Time 0731    PT Stop Time 0822    PT Time Calculation (min) 51 min    Activity Tolerance Patient tolerated treatment well;Patient limited by pain    Behavior During Therapy [facility] for tasks assessed/performed           Past Medical History:  Diagnosis Date  . Anemia   . Arthritis     Past Surgical History:  Procedure Laterality Date  . CESAREAN SECTION     x 2  . JOINT REPLACEMENT Left 04/2017   @ [place] Left 05/28/2017   Procedure: CLOSED MANIPULATION KNEE;  Surgeon: Leanor Kail, MD;  Location: [facility] ORS;  Service: Orthopedics;  Laterality: Left;  [place] Kitchen VARICOSE VEIN SURGERY      There were no vitals filed for this visit.   Subjective Assessment - 02/16/20 0734    Subjective Pt. states she is staying busy.  No c/o knee pain this morning.    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    Limitations Walking    How long can you stand comfortably? 15 minutes    How long can you walk comfortably? 10 mins    Patient Stated Goals Pt wants to get the most amount of motion back.    Currently in Pain? No/denies             There.ex.:  Nustep L4 10 min. B LE (warm-up/ discussed activities over birthday weekend).   Walking in clinic/ //-bars with focus on consistent step pattern and heel strike. High marching/ knee  flexion in //-bars. Lateral walking with partial squat.  Focusing on improved R hip control/ less limping  TM walking: focusing on more normalized gait pattern.  1.6 mph trying to decrease R hip hiking/ pistoning during L swing through phase of gait.    2nd step knee flexion 10x with static holds as tolerated.    Supine R hip/knee manual isometrics 5x (moderate to max. Resistance).    L sidelying R hip abduction 20x/ clamshell 10x2.    Walking cone taps at agility ladder.  4x (marked improvement with consistent step pattern)  Supine R knee flexion (static holds)- 114 deg.  TRX knee flexion 20x.       PT Long Term Goals - 02/04/20 1301      PT LONG TERM GOAL #1   Title Pt. will improve R knee flexion AROM/PROM to at least 100 degrees to improve functional mobility.    Baseline 10/5: PROM: 73 deg; 11/2: PROM 106 deg, AROM 100 deg    Time 4    Period Weeks    Status Achieved    Target Date 01/04/20      PT LONG TERM GOAL #2   Title Pt. 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    Baseline 10/5: pt can only stand for about 15 mins before needing a seated rest break. 11/2: pt can still only stand about 15 mins    Time 4    Period Weeks    Status Achieved    Target Date 02/03/20      PT LONG TERM GOAL #3   Title Pt. will increase R knee flex and ext strength by at least half a muscle grade to improve functional mobility.    Baseline 10/5: R knee ext: 4/5, R knee flex: 4+/5. 11/2: 5/5 R knee flex and ext.    Time 4    Period Weeks    Status Achieved    Target Date 01/04/20      PT LONG TERM GOAL #4   Title Pt. will be able to ambulate without AD and at most mild evidence of antalgic gait on the R to improve functional mobility.    Baseline 10/5: pt ambulates with moderate antalgic gait on R LE with quad cane. 11/2: Pt ambulates with mild-moderate antalgic gait with no AD    Time 4    Period Weeks     Status Partially Met    Target Date 03/02/20      PT LONG TERM GOAL #5   Title Pt. will walk approx 800 feet during 6 MWT with no standing rest breaks to improve ability to return to work.    Baseline 11/2: 555 feet with one standing rest break    Time 4    Period Weeks    Status On-going    Target Date 03/02/20      Additional Long Term Goals   Additional Long Term Goals Yes      PT LONG TERM GOAL #6   Title Pt. will demonstrtate >110 deg. R knee flexion in supine position with no assist to improve gait/ stair climbing.    Baseline Pain limited R knee flexion    Time 4    Period Weeks    Status New    Target Date 03/02/20                 Plan - 02/16/20 0735    Clinical Impression Statement PT focused on normalzing gait pattern in clinic without UE assist with focus on R hip control/ step pattern/ cadence.  Pt. demonstrates increase R hip/LE resistance during manual isometrics in supine position.  R knee joint stiffness noted but pt. able to increase R knee to >105 deg. consistently.  R glut med strengthening focus during stance phase to increase L step length symmetry as compared to R LE.    Personal Factors and Comorbidities Fitness;Past/Current Experience    Examination-Activity Limitations Bed Mobility;Lift;Squat;Stairs;Stand    Examination-Participation Restrictions Occupation    Stability/Clinical Decision Making Stable/Uncomplicated    Clinical Decision Making Low    Rehab Potential Good    PT Frequency 2x / week    PT Duration 4 weeks    PT Treatment/Interventions ADLs/Self Care Home Management;Cryotherapy;Therapeutic activities;Therapeutic exercise;Functional mobility training;Gait training;Balance training;Neuromuscular re-education;Patient/family education;Manual techniques;Scar mobilization;Passive range of motion;Stair training    PT Next Visit Plan Focus on normalizing gait/ increasing knee flexion.    PT Home Exercise Plan [id number]    Consulted and Agree  with Plan of Care Patient           Patient will benefit from skilled therapeutic intervention in order to improve the following  deficits and impairments:  Abnormal gait,Decreased activity tolerance,Decreased endurance,Decreased strength,Difficulty walking,Impaired flexibility,Increased edema,Hypomobility,Increased fascial restricitons,Improper body mechanics,Postural dysfunction,Pain,Decreased mobility,Decreased range of motion  Visit Diagnosis: Status post right knee replacement  Decreased range of motion (ROM) of right knee  Other abnormalities of gait and mobility  Muscle weakness (generalized)     Problem List Patient Active Problem List   Diagnosis Date Noted  . Varicose veins of left lower extremity with inflammation 04/14/2018  . Bilateral lower extremity edema 10/07/2017  . History of total left knee replacement (TKR) 10/07/2017   Pura Spice, PT, DPT # [phone] 02/16/2020, 2:57 PM  [facility] [address] [place], [place], [address] Phone: [phone]   Fax:  [phone]  Name: Grace Harris MRN: [id number] Date of Birth: 01/19/1966

## 2020-02-21 ENCOUNTER — Ambulatory Visit: Payer: BC Managed Care – PPO | Admitting: Physical Therapy

## 2020-02-21 ENCOUNTER — Encounter: Payer: Self-pay | Admitting: Physical Therapy

## 2020-02-21 ENCOUNTER — Other Ambulatory Visit: Payer: Self-pay

## 2020-02-21 DIAGNOSIS — Z96651 Presence of right artificial knee joint: Secondary | ICD-10-CM

## 2020-02-21 DIAGNOSIS — R2689 Other abnormalities of gait and mobility: Secondary | ICD-10-CM

## 2020-02-21 DIAGNOSIS — M25661 Stiffness of right knee, not elsewhere classified: Secondary | ICD-10-CM

## 2020-02-21 DIAGNOSIS — M6281 Muscle weakness (generalized): Secondary | ICD-10-CM

## 2020-02-21 NOTE — Therapy (Signed)
[facility] [address] [place], [place], [address] Phone: [phone]   Fax:  [phone]  Physical Therapy Treatment  Patient Details  Name: Grace Harris MRN: [id number] Date of Birth: 08/28/1965 Referring Provider (PT): Julian Hy, MD   Encounter Date: 02/21/2020   PT End of Session - 02/21/20 0720    Visit Number 21    Number of Visits 25    Date for PT Re-Evaluation 03/02/20    Authorization - Visit Number 5    Authorization - Number of Visits 10    PT Start Time 0729    PT Stop Time 0825    PT Time Calculation (min) 56 min    Activity Tolerance Patient tolerated treatment well;Patient limited by pain    Behavior During Therapy [facility] for tasks assessed/performed           Past Medical History:  Diagnosis Date   Anemia    Arthritis     Past Surgical History:  Procedure Laterality Date   CESAREAN SECTION     x 2   JOINT REPLACEMENT Left 04/2017   @ [place] Left 05/28/2017   Procedure: CLOSED MANIPULATION KNEE;  Surgeon: Leanor Kail, MD;  Location: [facility] ORS;  Service: Orthopedics;  Laterality: Left;   VARICOSE VEIN SURGERY      There were no vitals filed for this visit.   Subjective Assessment - 02/21/20 0719    Subjective Pt. reports no new complaints.  Pt. continues to report R hip/thigh discomfort with certain movement patterns.  Pt. entered PT with antalgic gait/ varying stance time on R LE.    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    Limitations Walking    How long can you stand comfortably? 15 minutes    How long can you walk comfortably? 10 mins    Patient Stated Goals Pt wants to get the most amount of motion back.    Currently in Pain? No/denies             There.ex.:  Nustep L4 10 min. B LE (warm-up/ discussed activities)  Walking in clinic/  //-bars with focus on consistent step pattern and heel strike. High marching/ knee flexion in //-bars. Lateral walking with partial squat.  Walking partial lunges in //-bars (no UE assist) 5x each. Focusing on improved R hip control/ less limping.  Cuing to increase R heel strike.    STS 10x with no UE assist (gray chair).  2nd step knee flexion 10x with static holds as tolerated.    Walking cone taps at agility ladder.  4x (marked improvement with consistent step pattern)  TRX knee flexion 20x.  Recip. Stair climbing with light to no UE assist 10x.  Extra time with R knee eccentric control during descending stairs.    Seated B LE muscle testing (B hip flexion MMT limited)   Seated R quad/ hamstring isometrics at blue mat table 3x each.       PT Long Term Goals - 02/04/20 1301      PT LONG TERM GOAL #1   Title Pt. will improve R knee flexion AROM/PROM to at least 100 degrees to improve functional mobility.    Baseline 10/5: PROM: 73 deg; 11/2: PROM 106 deg, AROM 100 deg    Time 4    Period Weeks  Status Achieved    Target Date 01/04/20      PT LONG TERM GOAL #2   Title Pt. 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    Baseline 10/5: pt can only stand for about 15 mins before needing a seated rest break. 11/2: pt can still only stand about 15 mins    Time 4    Period Weeks    Status Achieved    Target Date 02/03/20      PT LONG TERM GOAL #3   Title Pt. will increase R knee flex and ext strength by at least half a muscle grade to improve functional mobility.    Baseline 10/5: R knee ext: 4/5, R knee flex: 4+/5. 11/2: 5/5 R knee flex and ext.    Time 4    Period Weeks    Status Achieved    Target Date 01/04/20      PT LONG TERM GOAL #4   Title Pt. will be able to ambulate without AD and at most mild evidence of antalgic gait on the R to improve functional mobility.    Baseline 10/5: pt ambulates with  moderate antalgic gait on R LE with quad cane. 11/2: Pt ambulates with mild-moderate antalgic gait with no AD    Time 4    Period Weeks    Status Partially Met    Target Date 03/02/20      PT LONG TERM GOAL #5   Title Pt. will walk approx 800 feet during 6 MWT with no standing rest breaks to improve ability to return to work.    Baseline 11/2: 555 feet with one standing rest break    Time 4    Period Weeks    Status On-going    Target Date 03/02/20      Additional Long Term Goals   Additional Long Term Goals Yes      PT LONG TERM GOAL #6   Title Pt. will demonstrtate >110 deg. R knee flexion in supine position with no assist to improve gait/ stair climbing.    Baseline Pain limited R knee flexion    Time 4    Period Weeks    Status New    Target Date 03/02/20                 Plan - 02/21/20 0720    Clinical Impression Statement PT continues focus on a more normalized gait pattern with symmetrical swing through phase of gait/ no R antalgic gait.  Pt. able to correct gait but returns to R antalgic gait when not focusing on recip. pattern.  Pt. benefits from use of mirror and dynamic balance tasks to challenge R hip/LE.  R knee flexion/ extension AROM is WFL with B hip muscle weakness.  Pt. has 3 more tx. sessions scheduled.    Personal Factors and Comorbidities Fitness;Past/Current Experience    Examination-Activity Limitations Bed Mobility;Lift;Squat;Stairs;Stand    Examination-Participation Restrictions Occupation    Stability/Clinical Decision Making Stable/Uncomplicated    Clinical Decision Making Low    Rehab Potential Good    PT Frequency 2x / week    PT Duration 4 weeks    PT Treatment/Interventions ADLs/Self Care Home Management;Cryotherapy;Therapeutic activities;Therapeutic exercise;Functional mobility training;Gait training;Balance training;Neuromuscular re-education;Patient/family education;Manual techniques;Scar mobilization;Passive range of motion;Stair training     PT Next Visit Plan Focus on normalizing gait/ increasing knee flexion.    PT Home Exercise Plan [id number]  Consulted and Agree with Plan of Care Patient           Patient will benefit from skilled therapeutic intervention in order to improve the following deficits and impairments:  Abnormal gait,Decreased activity tolerance,Decreased endurance,Decreased strength,Difficulty walking,Impaired flexibility,Increased edema,Hypomobility,Increased fascial restricitons,Improper body mechanics,Postural dysfunction,Pain,Decreased mobility,Decreased range of motion  Visit Diagnosis: Status post right knee replacement  Decreased range of motion (ROM) of right knee  Other abnormalities of gait and mobility  Muscle weakness (generalized)     Problem List Patient Active Problem List   Diagnosis Date Noted   Varicose veins of left lower extremity with inflammation 04/14/2018   Bilateral lower extremity edema 10/07/2017   History of total left knee replacement (TKR) 10/07/2017   Pura Spice, PT, DPT # [id number] 02/21/2020, 10:41 AM  [facility] [address] [place], [place], [address] Phone: [phone]   Fax:  [phone]  Name: Grace Harris MRN: [id number] Date of Birth: 1965-09-21

## 2020-02-23 ENCOUNTER — Other Ambulatory Visit: Payer: Self-pay

## 2020-02-23 ENCOUNTER — Encounter: Payer: Self-pay | Admitting: Physical Therapy

## 2020-02-23 ENCOUNTER — Ambulatory Visit: Payer: BC Managed Care – PPO | Admitting: Physical Therapy

## 2020-02-23 DIAGNOSIS — Z96651 Presence of right artificial knee joint: Secondary | ICD-10-CM | POA: Diagnosis not present

## 2020-02-23 DIAGNOSIS — M25661 Stiffness of right knee, not elsewhere classified: Secondary | ICD-10-CM

## 2020-02-23 DIAGNOSIS — R2689 Other abnormalities of gait and mobility: Secondary | ICD-10-CM

## 2020-02-23 DIAGNOSIS — M6281 Muscle weakness (generalized): Secondary | ICD-10-CM

## 2020-02-23 NOTE — Therapy (Signed)
[facility] [address] [place], [place], [address] Phone: [phone]   Fax:  [phone]  Physical Therapy Treatment  Patient Details  Name: Grace Harris MRN: [id number] Date of Birth: Feb 25, 1966 Referring Provider (PT): Julian Hy, MD   Encounter Date: 02/23/2020   PT End of Session - 02/23/20 0718    Visit Number 22    Number of Visits 25    Date for PT Re-Evaluation 03/02/20    Authorization - Visit Number 6    Authorization - Number of Visits 10    PT Start Time 0729    PT Stop Time 0820    PT Time Calculation (min) 51 min    Activity Tolerance Patient tolerated treatment well;Patient limited by pain    Behavior During Therapy [facility] for tasks assessed/performed           Past Medical History:  Diagnosis Date  . Anemia   . Arthritis     Past Surgical History:  Procedure Laterality Date  . CESAREAN SECTION     x 2  . JOINT REPLACEMENT Left 04/2017   @ [place] Left 05/28/2017   Procedure: CLOSED MANIPULATION KNEE;  Surgeon: Leanor Kail, MD;  Location: [facility] ORS;  Service: Orthopedics;  Laterality: Left;  [place] Kitchen VARICOSE VEIN SURGERY      There were no vitals filed for this visit.   Subjective Assessment - 02/23/20 0718    Subjective Pt. states she [place] a [place] walking [place] the mall yesterday.  Pt. reports increase R knee soreness/ discomfort at end of day yesterday.  Pt. states she is focusing on walking pattern.    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    Limitations Walking    How long can you stand comfortably? 15 minutes    How long can you walk comfortably? 10 mins    Patient Stated Goals Pt wants to get the most amount of motion back.    Currently in Pain? No/denies              There.ex.:  Nustep L4 10 min. B LE (warm-up/ discussed activities at mall  yesterday)- knee flexion focus.    Walking in clinic with focus on consistent step pattern and heel strike. High marching/ knee flexion in //-bars. Focusing on improved R hip control/ less limping.  Cuing to increase R heel strike.    TG knee flexion 15x with good patellar tracking.     Manual tx.:  Supine R quad and hamstring stretches with static holds.  STM to R quad/ hamstring/ proximal gastroc. musculature.         PT Long Term Goals - 02/04/20 1301      PT LONG TERM GOAL #1   Title Pt. will improve R knee flexion AROM/PROM to at least 100 degrees to improve functional mobility.    Baseline 10/5: PROM: 73 deg; 11/2: PROM 106 deg, AROM 100 deg    Time 4    Period Weeks    Status Achieved    Target Date 01/04/20      PT LONG TERM GOAL #2   Title Pt. 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    Baseline 10/5: pt can only stand for about 15 mins before needing a seated  rest break. 11/2: pt can still only stand about 15 mins    Time 4    Period Weeks    Status Achieved    Target Date 02/03/20      PT LONG TERM GOAL #3   Title Pt. will increase R knee flex and ext strength by at least half a muscle grade to improve functional mobility.    Baseline 10/5: R knee ext: 4/5, R knee flex: 4+/5. 11/2: 5/5 R knee flex and ext.    Time 4    Period Weeks    Status Achieved    Target Date 01/04/20      PT LONG TERM GOAL #4   Title Pt. will be able to ambulate without AD and at most mild evidence of antalgic gait on the R to improve functional mobility.    Baseline 10/5: pt ambulates with moderate antalgic gait on R LE with quad cane. 11/2: Pt ambulates with mild-moderate antalgic gait with no AD    Time 4    Period Weeks    Status Partially Met    Target Date 03/02/20      PT LONG TERM GOAL #5   Title Pt. will walk approx 800 feet during 6 MWT with no standing rest breaks to improve ability to return to work.     Baseline 11/2: 555 feet with one standing rest break    Time 4    Period Weeks    Status On-going    Target Date 03/02/20      Additional Long Term Goals   Additional Long Term Goals Yes      PT LONG TERM GOAL #6   Title Pt. will demonstrtate >110 deg. R knee flexion in supine position with no assist to improve gait/ stair climbing.    Baseline Pain limited R knee flexion    Time 4    Period Weeks    Status New    Target Date 03/02/20               Plan - 02/23/20 0719    Clinical Impression Statement PT tx. limited by increase c/o R LE fatigue/ discomfort during tx. session today.  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 session.  PT focused on manual stretches/ supine based ther.ex. today.  Pts. primary limitation continues to be gait pattern/ walking endurance.  Pt. is able to correct gait for short distances but tends to return to a R antalgic gait with lateral lean with increase walking.    Personal Factors and Comorbidities Fitness;Past/Current Experience    Examination-Activity Limitations Bed Mobility;Lift;Squat;Stairs;Stand    Examination-Participation Restrictions Occupation    Stability/Clinical Decision Making Stable/Uncomplicated    Clinical Decision Making Low    Rehab Potential Good    PT Frequency 2x / week    PT Duration 4 weeks    PT Treatment/Interventions ADLs/Self Care Home Management;Cryotherapy;Therapeutic activities;Therapeutic exercise;Functional mobility training;Gait training;Balance training;Neuromuscular re-education;Patient/family education;Manual techniques;Scar mobilization;Passive range of motion;Stair training    PT Next Visit Plan Focus on normalizing gait/ increasing knee flexion.    PT Home Exercise Plan [id number]    Consulted and Agree with Plan of Care Patient           Patient will benefit from skilled therapeutic intervention in order to improve the following deficits and impairments:  Abnormal  gait,Decreased activity tolerance,Decreased endurance,Decreased strength,Difficulty walking,Impaired flexibility,Increased edema,Hypomobility,Increased fascial restricitons,Improper body mechanics,Postural dysfunction,Pain,Decreased mobility,Decreased range of motion  Visit Diagnosis: Status post right knee replacement  Decreased range of motion (ROM) of right knee  Other abnormalities of gait and mobility  Muscle weakness (generalized)     Problem List Patient Active Problem List   Diagnosis Date Noted  . Varicose veins of left lower extremity with inflammation 04/14/2018  . Bilateral lower extremity edema 10/07/2017  . History of total left knee replacement (TKR) 10/07/2017   Pura Spice, PT, DPT # [id number] 02/23/2020, 9:36 AM  [facility] [address] [place], [place], [address] Phone: [phone]   Fax:  [phone]  Name: TANNYA GONET MRN: [id number] Date of Birth: Jul 28, 1965

## 2020-02-28 ENCOUNTER — Ambulatory Visit: Payer: BC Managed Care – PPO | Admitting: Physical Therapy

## 2020-03-01 ENCOUNTER — Ambulatory Visit: Payer: BC Managed Care – PPO | Admitting: Physical Therapy

## 2020-04-19 ENCOUNTER — Ambulatory Visit
Admission: RE | Admit: 2020-04-19 | Discharge: 2020-04-19 | Disposition: A | Discharge status: Home / Self Care | Payer: BC Managed Care – PPO | Source: Ambulatory Visit | Attending: Family Medicine | Admitting: Family Medicine

## 2020-04-19 ENCOUNTER — Ambulatory Visit: Payer: Self-pay

## 2020-04-19 ENCOUNTER — Encounter: Payer: Self-pay | Admitting: Family Medicine

## 2020-04-19 ENCOUNTER — Ambulatory Visit
Admission: RE | Admit: 2020-04-19 | Discharge: 2020-04-19 | Disposition: A | Discharge status: Home / Self Care | Payer: BC Managed Care – PPO | Attending: Family Medicine | Admitting: Family Medicine

## 2020-04-19 ENCOUNTER — Ambulatory Visit: Payer: BC Managed Care – PPO | Admitting: Family Medicine

## 2020-04-19 ENCOUNTER — Other Ambulatory Visit: Payer: Self-pay

## 2020-04-19 VITALS — BP 120/80 | HR 80 | Ht 62.0 in | Wt 255.0 lb

## 2020-04-19 DIAGNOSIS — Z1211 Encounter for screening for malignant neoplasm of colon: Secondary | ICD-10-CM | POA: Diagnosis not present

## 2020-04-19 DIAGNOSIS — M5412 Radiculopathy, cervical region: Secondary | ICD-10-CM

## 2020-04-19 MED ORDER — PREDNISONE 10 MG PO TABS: 10.0000 mg | ORAL_TABLET | Freq: Every day | ORAL | 0 refills | Status: DC

## 2020-04-19 MED ORDER — MELOXICAM 15 MG PO TABS: 15.0000 mg | ORAL_TABLET | Freq: Every day | ORAL | 0 refills | Status: DC

## 2020-04-19 NOTE — Progress Notes (Signed)
Date:  04/19/2020   Name:  Grace Harris   DOB:  05-12-1965   MRN:  [id number]   Chief Complaint: Arm Pain (R) arm tingling radiating down to hand)  Neurologic Problem The patient's pertinent negatives include no focal sensory loss, focal weakness, syncope or weakness. Primary symptoms comment: right arm paresthesia. This is a new problem. The current episode started 1 to 4 weeks ago (3 weeks ago). The neurological problem developed gradually. The problem has been waxing and waning since onset. There was right-sided focality noted. Pertinent negatives include no abdominal pain, auditory change, aura, back pain, bladder incontinence, bowel incontinence, chest pain, confusion, diaphoresis, dizziness, fatigue, fever, headaches, light-headedness, nausea, neck pain, palpitations, shortness of breath, vertigo or vomiting. Past treatments include nothing.    Lab Results  Component Value Date   CREATININE 0.9 11/10/2019   BUN 17 11/10/2019   NA 139 11/10/2019   K 4.1 11/10/2019   CL 106 11/10/2019   CO2 23 10/13/2019   Lab Results  Component Value Date   CHOL 214 (H) 09/09/2018   HDL 62 09/09/2018   LDLCALC 142 (H) 09/09/2018   TRIG 50 09/09/2018   CHOLHDL 3.5 09/09/2018   No results found for: TSH Lab Results  Component Value Date   HGBA1C 5.9 11/10/2019   Lab Results  Component Value Date   WBC 5.7 11/10/2019   HGB 11.5 (A) 11/10/2019   HCT 36 11/10/2019   MCV 83 10/13/2019   PLT 203 11/10/2019   Lab Results  Component Value Date   ALT 12 09/09/2018   AST 14 09/09/2018   ALKPHOS 45 09/09/2018   BILITOT 0.2 09/09/2018     Review of Systems  Constitutional: Negative.  Negative for chills, diaphoresis, fatigue, fever and unexpected weight change.  HENT: Negative for congestion, ear discharge, ear pain, rhinorrhea, sinus pressure, sneezing and sore throat.   Eyes: Negative for double vision, photophobia, pain, discharge, redness and itching.  Respiratory: Negative  for cough, shortness of breath, wheezing and stridor.   Cardiovascular: Negative for chest pain and palpitations.  Gastrointestinal: Negative for abdominal pain, blood in stool, bowel incontinence, constipation, diarrhea, nausea and vomiting.  Endocrine: Negative for cold intolerance, heat intolerance, polydipsia, polyphagia and polyuria.  Genitourinary: Negative for bladder incontinence, dysuria, flank pain, frequency, hematuria, menstrual problem, pelvic pain, urgency, vaginal bleeding and vaginal discharge.  Musculoskeletal: Negative for arthralgias, back pain, myalgias and neck pain.  Skin: Negative for rash.  Allergic/Immunologic: Negative for environmental allergies and food allergies.  Neurological: Negative for dizziness, vertigo, tremors, focal weakness, seizures, syncope, facial asymmetry, speech difficulty, weakness, light-headedness, numbness and headaches.  Hematological: Negative for adenopathy. Does not bruise/bleed easily.  Psychiatric/Behavioral: Negative for confusion and dysphoric mood. The patient is not nervous/anxious.     Patient Active Problem List   Diagnosis Date Noted  . Varicose veins of left lower extremity with inflammation 04/14/2018  . Bilateral lower extremity edema 10/07/2017  . History of total left knee replacement (TKR) 10/07/2017    No Known Allergies  Past Surgical History:  Procedure Laterality Date  . CESAREAN SECTION     x 2  . JOINT REPLACEMENT Left 04/2017   @ [facility] [place]  . KNEE CLOSED REDUCTION Left 05/28/2017   Procedure: CLOSED MANIPULATION KNEE;  Surgeon: Erin Sons, MD;  Location: [facility] ORS;  Service: Orthopedics;  Laterality: Left;  Marland Kitchen VARICOSE VEIN SURGERY      Social History   Tobacco Use  . Smoking status:  Never Smoker  . Smokeless tobacco: Never Used  Vaping Use  . Vaping Use: Never used  Substance Use Topics  . Alcohol use: Yes    Alcohol/week: 0.0 standard drinks    Comment: RARE  . Drug use:  No     Medication list has been reviewed and updated.  Current Meds  Medication Sig  . naproxen (NAPROSYN) 500 MG tablet Take by mouth.    PHQ 2/9 Scores 04/19/2020 12/15/2019 10/13/2019 09/09/2018  PHQ - 2 Score 0 0 0 0  PHQ- 9 Score 0 0 0 1    GAD 7 : Generalized Anxiety Score 04/19/2020 12/15/2019 10/13/2019  Nervous, Anxious, on Edge 0 0 0  Control/stop worrying 0 0 0  Worry too much - different things 0 0 0  Trouble relaxing 0 0 0  Restless 0 0 0  Easily annoyed or irritable 0 0 0  Afraid - awful might happen 0 0 0  Total GAD 7 Score 0 0 0    BP Readings from Last 3 Encounters:  04/19/20 120/80  12/15/19 130/80  10/13/19 110/76    Physical Exam Vitals and nursing note reviewed.  Constitutional:      Appearance: She is well-developed and well-nourished.  HENT:     Head: Normocephalic.     Right Ear: Tympanic membrane, ear canal and external ear normal. There is no impacted cerumen.     Left Ear: Tympanic membrane, ear canal and external ear normal. There is no impacted cerumen.     Nose: Nose normal.     Mouth/Throat:     Mouth: Oropharynx is clear and moist.  Eyes:     General: Lids are everted, no foreign bodies appreciated. No scleral icterus.       Left eye: No foreign body or hordeolum.     Extraocular Movements: EOM normal.     Conjunctiva/sclera: Conjunctivae normal.     Right eye: Right conjunctiva is not injected.     Left eye: Left conjunctiva is not injected.     Pupils: Pupils are equal, round, and reactive to light.  Neck:     Thyroid: No thyromegaly.     Vascular: No JVD.     Trachea: No tracheal deviation.  Cardiovascular:     Rate and Rhythm: Normal rate and regular rhythm.     Pulses: Intact distal pulses.     Heart sounds: Normal heart sounds, S1 normal and S2 normal. No murmur heard.  No systolic murmur is present.  No diastolic murmur is present. No friction rub. No gallop. No S3 or S4 sounds.   Pulmonary:     Effort: Pulmonary effort  is normal. No respiratory distress.     Breath sounds: Normal breath sounds. No decreased breath sounds, wheezing, rhonchi or rales.  Abdominal:     General: Bowel sounds are normal.     Palpations: Abdomen is soft. There is no hepatosplenomegaly or mass.     Tenderness: There is no abdominal tenderness. There is no guarding or rebound.  Musculoskeletal:        General: No tenderness or edema. Normal range of motion.     Cervical back: Normal range of motion and neck supple.  Lymphadenopathy:     Cervical: No cervical adenopathy.  Skin:    General: Skin is warm.     Findings: No rash.  Neurological:     Mental Status: She is alert and oriented to person, place, and time.     Cranial Nerves: Cranial  nerves are intact. No cranial nerve deficit.     Sensory: Sensation is intact. No sensory deficit.     Motor: Motor function is intact. No weakness or tremor.     Deep Tendon Reflexes: Strength normal. Reflexes normal.  Psychiatric:        Mood and Affect: Mood and affect normal. Mood is not anxious or depressed.     Wt Readings from Last 3 Encounters:  04/19/20 255 lb (115.7 kg)  12/15/19 234 lb (106.1 kg)  10/13/19 234 lb (106.1 kg)    BP 120/80   Pulse 80   Ht 5\' 2"  (1.575 m)   Wt 255 lb (115.7 kg)   LMP 02/12/2020   BMI 46.64 kg/m   Assessment and Plan: 1. Cervical radiculopathy New onset.  Intermittent.  This is a pain that has developed from the on the left down the arm on the right.  This is not exacerbated by movement.  I suspect there is some suggestion of cervical disc and we will proceed with evaluation with a C-spine and initiation of meloxicam 15 mg 10 mg of prednisone and possible evaluation with orthopedics.  It was discussed with the patient that she will need to discontinue her Naprosyn when taking the prednisone and meloxicam. - DG Cervical Spine Complete; Future - meloxicam (MOBIC) 15 MG tablet; Take 1 tablet (15 mg total) by mouth daily.  Dispense: 30 tablet;  Refill: 0 - predniSONE (DELTASONE) 10 MG tablet; Take 1 tablet (10 mg total) by mouth daily with breakfast.  Dispense: 30 tablet; Refill: 0  2. Colon cancer screening Discussion and referral made to gastroenterology for evaluation for colon cancer. - Ambulatory referral to Gastroenterology

## 2020-04-19 NOTE — Telephone Encounter (Signed)
States she has had tingling right arm x 4 weeks. Comes and goes.No neck pain. No weakness noted. No other symptoms. Pt. Has an appointment today.  Answer Assessment - Initial Assessment Questions 1. SYMPTOM: "What is the main symptom you are concerned about?" (e.g., weakness, numbness)     Tingling right arm 2. ONSET: "When did this start?" (minutes, hours, days; while sleeping)     4 weeks ago 3. LAST NORMAL: "When was the last time you were normal (no symptoms)?"     1 month ago 4. PATTERN "Does this come and go, or has it been constant since it started?"  "Is it present now?"     Comes and goes 5. CARDIAC SYMPTOMS: "Have you had any of the following symptoms: chest pain, difficulty breathing, palpitations?"     No 6. NEUROLOGIC SYMPTOMS: "Have you had any of the following symptoms: headache, dizziness, vision loss, double vision, changes in speech, unsteady on your feet?"     No 7. OTHER SYMPTOMS: "Do you have any other symptoms?"     No 8. PREGNANCY: "Is there any chance you are pregnant?" "When was your last menstrual period?"     No  Protocols used: NEUROLOGIC DEFICIT-A-AH

## 2020-04-20 ENCOUNTER — Encounter: Payer: Self-pay | Admitting: Family Medicine

## 2020-04-26 ENCOUNTER — Telehealth (INDEPENDENT_AMBULATORY_CARE_PROVIDER_SITE_OTHER): Payer: Self-pay | Admitting: Gastroenterology

## 2020-04-26 ENCOUNTER — Other Ambulatory Visit: Payer: Self-pay

## 2020-04-26 DIAGNOSIS — Z1211 Encounter for screening for malignant neoplasm of colon: Secondary | ICD-10-CM

## 2020-04-26 MED ORDER — CLENPIQ 10-3.5-12 MG-GM -GM/160ML PO SOLN: 320.0000 mL | Freq: Once | ORAL | 0 refills | Status: AC

## 2020-04-26 NOTE — Progress Notes (Signed)
Gastroenterology Pre-Procedure Review  Request Date: 05/09/2020 Requesting Physician: Dr. Maximino Greenland   PATIENT REVIEW QUESTIONS: The patient responded to the following health history questions as indicated:    1. Are you having any GI issues? no 2. Do you have a personal history of Polyps? no 3. Do you have a family history of Colon Cancer or Polyps? no 4. Diabetes Mellitus? no 5. Joint replacements in the past 12 months?Yes total knee replacement on 11/16/2019 6. Major health problems in the past 3 months?no 7. Any artificial heart valves, MVP, or defibrillator?no    MEDICATIONS & ALLERGIES:    Patient reports the following regarding taking any anticoagulation/antiplatelet therapy:   Plavix, Coumadin, Eliquis, Xarelto, Lovenox, Pradaxa, Brilinta, or Effient? no Aspirin? no  Patient confirms/reports the following medications:  Current Outpatient Medications  Medication Sig Dispense Refill  . Clobetasol Prop Emollient Base (CLOBETASOL PROPIONATE E) 0.05 % emollient cream Apply 1 application topically 2 (two) times daily. (Patient not taking: Reported on 04/19/2020) 30 g 0  . meloxicam (MOBIC) 15 MG tablet Take 1 tablet (15 mg total) by mouth daily. 30 tablet 0  . naproxen (NAPROSYN) 500 MG tablet Take by mouth.    . predniSONE (DELTASONE) 10 MG tablet Take 1 tablet (10 mg total) by mouth daily with breakfast. 30 tablet 0   No current facility-administered medications for this visit.    Patient confirms/reports the following allergies:  No Known Allergies  No orders of the defined types were placed in this encounter.   AUTHORIZATION INFORMATION Primary Insurance: 1D#: Group #:  Secondary Insurance: 1D#: Group #:  SCHEDULE INFORMATION: Date:  Time: Location:

## 2020-05-03 ENCOUNTER — Other Ambulatory Visit: Payer: Self-pay

## 2020-05-03 ENCOUNTER — Encounter: Payer: Self-pay | Admitting: Gastroenterology

## 2020-05-04 ENCOUNTER — Other Ambulatory Visit
Admission: RE | Admit: 2020-05-04 | Discharge: 2020-05-04 | Disposition: A | Discharge status: Home / Self Care | Payer: BC Managed Care – PPO | Source: Ambulatory Visit | Attending: Gastroenterology | Admitting: Gastroenterology

## 2020-05-04 DIAGNOSIS — Z20822 Contact with and (suspected) exposure to covid-19: Secondary | ICD-10-CM | POA: Insufficient documentation

## 2020-05-04 DIAGNOSIS — Z01812 Encounter for preprocedural laboratory examination: Secondary | ICD-10-CM | POA: Diagnosis not present

## 2020-05-04 LAB — SARS CORONAVIRUS 2 (TAT 6-24 HRS): SARS Coronavirus 2: NEGATIVE

## 2020-05-05 ENCOUNTER — Other Ambulatory Visit: Payer: BC Managed Care – PPO

## 2020-05-08 NOTE — Discharge Instructions (Signed)

## 2020-05-09 ENCOUNTER — Ambulatory Visit: Payer: BC Managed Care – PPO | Admitting: Anesthesiology

## 2020-05-09 ENCOUNTER — Encounter
Admission: RE | Disposition: A | Discharge status: Home / Self Care | Payer: Self-pay | Source: Home / Self Care | Attending: Gastroenterology

## 2020-05-09 ENCOUNTER — Encounter: Payer: Self-pay | Admitting: Gastroenterology

## 2020-05-09 ENCOUNTER — Ambulatory Visit
Admission: RE | Admit: 2020-05-09 | Discharge: 2020-05-09 | Disposition: A | Discharge status: Home / Self Care | Payer: BC Managed Care – PPO | Attending: Gastroenterology | Admitting: Gastroenterology

## 2020-05-09 ENCOUNTER — Other Ambulatory Visit: Payer: Self-pay

## 2020-05-09 DIAGNOSIS — Z833 Family history of diabetes mellitus: Secondary | ICD-10-CM | POA: Diagnosis not present

## 2020-05-09 DIAGNOSIS — Z7952 Long term (current) use of systemic steroids: Secondary | ICD-10-CM | POA: Insufficient documentation

## 2020-05-09 DIAGNOSIS — Z79899 Other long term (current) drug therapy: Secondary | ICD-10-CM | POA: Insufficient documentation

## 2020-05-09 DIAGNOSIS — Z1211 Encounter for screening for malignant neoplasm of colon: Secondary | ICD-10-CM | POA: Insufficient documentation

## 2020-05-09 DIAGNOSIS — Z809 Family history of malignant neoplasm, unspecified: Secondary | ICD-10-CM | POA: Insufficient documentation

## 2020-05-09 DIAGNOSIS — Z791 Long term (current) use of non-steroidal anti-inflammatories (NSAID): Secondary | ICD-10-CM | POA: Diagnosis not present

## 2020-05-09 HISTORY — PX: COLONOSCOPY WITH PROPOFOL: SHX5780

## 2020-05-09 LAB — POCT PREGNANCY, URINE: Preg Test, Ur: NEGATIVE

## 2020-05-09 SURGERY — COLONOSCOPY WITH PROPOFOL
Anesthesia: General | Site: Rectum

## 2020-05-09 MED ORDER — LACTATED RINGERS IV SOLN: INTRAVENOUS | Status: DC | PRN

## 2020-05-09 MED ORDER — STERILE WATER FOR IRRIGATION IR SOLN
Status: DC | PRN
  Administered 2020-05-09: .05 mL

## 2020-05-09 MED ORDER — LIDOCAINE HCL (CARDIAC) PF 100 MG/5ML IV SOSY
PREFILLED_SYRINGE | INTRAVENOUS | Status: DC | PRN
  Administered 2020-05-09: 50 mg via INTRAVENOUS

## 2020-05-09 MED ORDER — PROPOFOL 10 MG/ML IV BOLUS
INTRAVENOUS | Status: DC | PRN
  Administered 2020-05-09: 30 mg via INTRAVENOUS
  Administered 2020-05-09: 140 mg via INTRAVENOUS
  Administered 2020-05-09: 40 mg via INTRAVENOUS
  Administered 2020-05-09 (×2): 30 mg via INTRAVENOUS

## 2020-05-09 MED ORDER — SODIUM CHLORIDE 0.9 % IV SOLN: INTRAVENOUS | Status: DC

## 2020-05-09 SURGICAL SUPPLY — 6 items
GOWN CVR UNV OPN BCK APRN NK (MISCELLANEOUS) ×2 IMPLANT
GOWN ISOL THUMB LOOP REG UNIV (MISCELLANEOUS) ×4
KIT PRC NS LF DISP ENDO (KITS) ×1 IMPLANT
KIT PROCEDURE OLYMPUS (KITS) ×2
MANIFOLD NEPTUNE II (INSTRUMENTS) ×2 IMPLANT
WATER STERILE IRR 250ML POUR (IV SOLUTION) ×2 IMPLANT

## 2020-05-09 NOTE — Anesthesia Preprocedure Evaluation (Addendum)
Anesthesia Evaluation  Patient identified by MRN, date of birth, ID band Patient awake    History of Anesthesia Complications Negative for: history of anesthetic complications  Airway Mallampati: III  TM Distance: >3 FB Neck ROM: Full    Dental no notable dental hx.    Pulmonary neg pulmonary ROS,    Pulmonary exam normal        Cardiovascular Exercise Tolerance: Good negative cardio ROS Normal cardiovascular exam     Neuro/Psych negative neurological ROS  negative psych ROS   GI/Hepatic negative GI ROS, Neg liver ROS,   Endo/Other  Morbid obesity (BMI 45)  Renal/GU negative Renal ROS     Musculoskeletal negative musculoskeletal ROS (+)   Abdominal   Peds  Hematology negative hematology ROS (+)   Anesthesia Other Findings   Reproductive/Obstetrics                            Anesthesia Physical Anesthesia Plan  ASA: III  Anesthesia Plan: General   Post-op Pain Management:    Induction: Intravenous  PONV Risk Score and Plan: 2 and Treatment may vary due to age or medical condition, TIVA and Propofol infusion  Airway Management Planned: Nasal Cannula and Natural Airway  Additional Equipment: None  Intra-op Plan:   Post-operative Plan:   Informed Consent: I have reviewed the patients History and Physical, chart, labs and discussed the procedure including the risks, benefits and alternatives for the proposed anesthesia with the patient or authorized representative who has indicated his/her understanding and acceptance.       Plan Discussed with: CRNA  Anesthesia Plan Comments:        Anesthesia Quick Evaluation

## 2020-05-09 NOTE — H&P (Signed)
Melodie Bouillon, MD [address], [address], [place], [place], [address] [address], [place], [place], [address] Phone: [phone]  Fax: [phone]  Primary Care Physician:  Duanne Limerick, MD   Pre-Procedure History & Physical: HPI:  Grace Harris is a 55 y.o. female is here for a colonoscopy.   Past Medical History:  Diagnosis Date  . Anemia   . Arthritis     Past Surgical History:  Procedure Laterality Date  . CESAREAN SECTION     x 2  . JOINT REPLACEMENT Left 04/2017   @ [facility] [place]  . KNEE CLOSED REDUCTION Left 05/28/2017   Procedure: CLOSED MANIPULATION KNEE;  Surgeon: Erin Sons, MD;  Location: [facility] ORS;  Service: Orthopedics;  Laterality: Left;  Marland Kitchen VARICOSE VEIN SURGERY      Prior to Admission medications   Medication Sig Start Date End Date Taking? Authorizing Provider  Clobetasol Prop Emollient Base (CLOBETASOL PROPIONATE E) 0.05 % emollient cream Apply 1 application topically 2 (two) times daily. 10/13/19  Yes Duanne Limerick, MD  meloxicam (MOBIC) 15 MG tablet Take 1 tablet (15 mg total) by mouth daily. 04/19/20  Yes Duanne Limerick, MD  predniSONE (DELTASONE) 10 MG tablet Take 1 tablet (10 mg total) by mouth daily with breakfast. 04/19/20  Yes Duanne Limerick, MD  naproxen (NAPROSYN) 500 MG tablet Take by mouth. Patient not taking: Reported on 05/03/2020 08/29/17   [provider]    Allergies as of 04/26/2020  . (No Known Allergies)    Family History  Problem Relation Age of Onset  . Diabetes Maternal Uncle   . Cancer Maternal Grandmother   . Alcohol abuse Mother   . Breast cancer Neg Hx     Social History   Socioeconomic History  . Marital status: Single    Spouse name: Not on file  . Number of children: Not on file  . Years of education: Not on file  . Highest education level: Not on file  Occupational History  . Not on file  Tobacco Use  . Smoking status: Never Smoker  . Smokeless  tobacco: Never Used  Vaping Use  . Vaping Use: Never used  Substance and Sexual Activity  . Alcohol use: Yes    Alcohol/week: 0.0 standard drinks    Comment: RARE (2x/month)  . Drug use: No  . Sexual activity: Yes  Other Topics Concern  . Not on file  Social History Narrative  . Not on file   Social Determinants of Health   Financial Resource Strain: Not on file  Food Insecurity: Not on file  Transportation Needs: Not on file  Physical Activity: Not on file  Stress: Not on file  Social Connections: Not on file  Intimate Partner Violence: Not on file    Review of Systems: See HPI, otherwise negative ROS  Physical Exam: BP 125/69   Pulse 75   Temp 97.6 F (36.4 C)   Ht 5\' 2"  (1.575 m)   Wt 112 kg   LMP 05/01/2020 (Exact Date)   SpO2 99%   BMI 45.18 kg/m  General:   Alert,  pleasant and cooperative in NAD Head:  Normocephalic and atraumatic. Neck:  Supple; no masses or thyromegaly. Lungs:  Clear throughout to auscultation, normal respiratory effort.    Heart:  +S1, +S2, Regular rate and rhythm, No edema. Abdomen:  Soft, nontender and nondistended. Normal bowel sounds, without guarding, and without rebound.   Neurologic:  Alert and  oriented x4;  grossly normal neurologically.  Impression/Plan: Grace Harris is here for a colonoscopy to be performed for average risk screening.  Risks, benefits, limitations, and alternatives regarding  colonoscopy have been reviewed with the patient.  Questions have been answered.  All parties agreeable.   Pasty Spillers, MD  05/09/2020, 12:10 PM

## 2020-05-09 NOTE — Op Note (Signed)
[facility] Gastroenterology Patient Name: Grace Harris Procedure Date: 05/09/2020 11:23 AM MRN: [id number] Account #: [id number] Date of Birth: 20-Jan-1966 Admit Type: Outpatient Age: 55 Room: [facility] OR ROOM 01 Gender: Female Note Status: Finalized Procedure:             Colonoscopy Indications:           Screening for colorectal malignant neoplasm Providers:             Varina Hulon B. Maximino Greenland MD, MD Referring MD:          Duanne Limerick, MD (Referring MD) Medicines:             Monitored Anesthesia Care Complications:         No immediate complications. Procedure:             Pre-Anesthesia Assessment:                        - Prior to the procedure, a History and Physical was                         performed, and patient medications, allergies and                         sensitivities were reviewed. The patient's tolerance                         of previous anesthesia was reviewed.                        - The risks and benefits of the procedure and the                         sedation options and risks were discussed with the                         patient. All questions were answered and informed                         consent was obtained.                        - Patient identification and proposed procedure were                         verified prior to the procedure by the physician, the                         nurse, the anesthetist and the technician. The                         procedure was verified in the pre-procedure area in                         the procedure room in the endoscopy suite.                        - ASA Grade Assessment: II - A patient with mild  systemic disease.                        - After reviewing the risks and benefits, the patient                         was deemed in satisfactory condition to undergo the                         procedure.                        After obtaining informed consent,  the colonoscope was                         passed under direct vision. Throughout the procedure,                         the patient's blood pressure, pulse, and oxygen                         saturations were monitored continuously. The                         Colonoscope was introduced through the anus and                         advanced to the the cecum, identified by appendiceal                         orifice and ileocecal valve. The colonoscopy was                         performed with ease. The patient tolerated the                         procedure well. The quality of the bowel preparation                         was good. Findings:      The perianal and digital rectal examinations were normal.      The rectum, sigmoid colon, descending colon, transverse colon, ascending       colon and cecum appeared normal.      The retroflexed view of the distal rectum and anal verge was normal and       showed no anal or rectal abnormalities. Impression:            - The rectum, sigmoid colon, descending colon,                         transverse colon, ascending colon and cecum are normal.                        - The distal rectum and anal verge are normal on                         retroflexion view.                        - No  specimens collected. Recommendation:        - Discharge patient to home.                        - Resume previous diet.                        - Continue present medications.                        - Repeat colonoscopy in 10 years for screening                         purposes.                        - Return to primary care physician as previously                         scheduled.                        - The findings and recommendations were discussed with                         the patient.                        - The findings and recommendations were discussed with                         the patient's family.                        - In the future, if  patient develops new symptoms such                         as blood per rectum, abdominal pain, weight loss,                         altered bowel habits or any other reason for concern,                         patient should discuss this with thier PCP as they may                         need a GI referral at that time or evaluation for need                         for colonoscopy earlier than the recommended screening                         colonoscopy.                        In addition, if patient's family history of colon                         cancer changes (no family history at this time) in the  future, earlier screening may be indicated and patient                         should discuss this with PCP as well. Procedure Code(s):     --- Professional ---                        [phone], Colonoscopy, flexible; diagnostic, including                         collection of specimen(s) by brushing or washing, when                         performed (separate procedure) Diagnosis Code(s):     --- Professional ---                        Z12.11, Encounter for screening for malignant neoplasm                         of colon CPT copyright 2019 [employer]. All rights reserved. The codes documented in this report are preliminary and upon coder review may  be revised to meet current compliance requirements.  Melodie Bouillon, MD Michel Bickers B. Maximino Greenland MD, MD 05/09/2020 12:51:20 PM This report has been signed electronically. Number of Addenda: 0 Note Initiated On: 05/09/2020 11:23 AM Scope Withdrawal Time: 0 hours 12 minutes 10 seconds  Total Procedure Duration: 0 hours 16 minutes 1 second       [facility]

## 2020-05-09 NOTE — Transfer of Care (Signed)
Immediate Anesthesia Transfer of Care Note  Patient: Grace Harris  Procedure(s) Performed: COLONOSCOPY WITH PROPOFOL (N/A Rectum)  Patient Location: PACU  Anesthesia Type: General  Level of Consciousness: awake, alert  and patient cooperative  Airway and Oxygen Therapy: Patient Spontanous Breathing and Patient connected to supplemental oxygen  Post-op Assessment: Post-op Vital signs reviewed, Patient's Cardiovascular Status Stable, Respiratory Function Stable, Patent Airway and No signs of Nausea or vomiting  Post-op Vital Signs: Reviewed and stable  Complications: No complications documented.

## 2020-05-09 NOTE — Anesthesia Postprocedure Evaluation (Signed)
Anesthesia Post Note  Patient: Grace Harris  Procedure(s) Performed: COLONOSCOPY WITH PROPOFOL (N/A Rectum)     Patient location during evaluation: PACU Anesthesia Type: General Level of consciousness: awake and alert Pain management: pain level controlled Vital Signs Assessment: post-procedure vital signs reviewed and stable Respiratory status: spontaneous breathing, nonlabored ventilation, respiratory function stable and patient connected to nasal cannula oxygen Cardiovascular status: blood pressure returned to baseline and stable Postop Assessment: no apparent nausea or vomiting Anesthetic complications: no   No complications documented.  Wille Celeste Arel Tippen

## 2020-05-10 ENCOUNTER — Encounter: Payer: Self-pay | Admitting: Gastroenterology

## 2020-07-01 ENCOUNTER — Encounter: Payer: Self-pay | Admitting: Family Medicine

## 2020-07-03 ENCOUNTER — Encounter: Payer: Self-pay | Admitting: Family Medicine

## 2020-07-03 ENCOUNTER — Ambulatory Visit: Payer: BC Managed Care – PPO | Admitting: Family Medicine

## 2020-07-03 ENCOUNTER — Other Ambulatory Visit: Payer: Self-pay

## 2020-07-03 VITALS — BP 118/88 | HR 72 | Temp 99.0°F | Ht 62.0 in | Wt 253.0 lb

## 2020-07-03 DIAGNOSIS — J0100 Acute maxillary sinusitis, unspecified: Secondary | ICD-10-CM | POA: Diagnosis not present

## 2020-07-03 DIAGNOSIS — M5412 Radiculopathy, cervical region: Secondary | ICD-10-CM

## 2020-07-03 DIAGNOSIS — R059 Cough, unspecified: Secondary | ICD-10-CM

## 2020-07-03 MED ORDER — AMOXICILLIN 500 MG PO CAPS: 500.0000 mg | ORAL_CAPSULE | Freq: Three times a day (TID) | ORAL | 0 refills | Status: AC

## 2020-07-03 MED ORDER — GUAIFENESIN-CODEINE 100-10 MG/5ML PO SYRP: 5.0000 mL | ORAL_SOLUTION | Freq: Four times a day (QID) | ORAL | 0 refills | Status: DC | PRN

## 2020-07-03 NOTE — Progress Notes (Signed)
Date:  07/03/2020   Name:  Grace Harris   DOB:  01-Nov-1965   MRN:  [id number]   Chief Complaint: Sinusitis (Sinus congestion in face, drainage, coughing up white production, especially at night. Have to sleep sitting up. Has tried Mucinex and alkaseltzer cold. ) and Neck Pain (Has been on trial of meloxicam and prednisone- wants referral to ortho)  Sinusitis This is a new problem. The current episode started in the past 7 days. The problem is unchanged. Maximum temperature: 99. Her pain is at a severity of 2/10 (bilateral maxillary). The pain is moderate. Associated symptoms include congestion, neck pain, sinus pressure and sneezing. Pertinent negatives include no chills, coughing, diaphoresis, ear pain, headaches, hoarse voice, shortness of breath, sore throat or swollen glands. (Nasal production) Past treatments include oral decongestants. The treatment provided mild relief.  Neck Pain  This is a recurrent problem. The current episode started more than 1 month ago. The problem occurs intermittently. The problem has been unchanged. The quality of the pain is described as aching. The pain is mild. The pain is same all the time. Pertinent negatives include no chest pain, fever, headaches, numbness, photophobia or weakness. She has tried NSAIDs (prednisone) for the symptoms. The treatment provided no relief.    Lab Results  Component Value Date   CREATININE 0.9 11/10/2019   BUN 17 11/10/2019   NA 139 11/10/2019   K 4.1 11/10/2019   CL 106 11/10/2019   CO2 23 10/13/2019   Lab Results  Component Value Date   CHOL 214 (H) 09/09/2018   HDL 62 09/09/2018   LDLCALC 142 (H) 09/09/2018   TRIG 50 09/09/2018   CHOLHDL 3.5 09/09/2018   No results found for: TSH Lab Results  Component Value Date   HGBA1C 5.9 11/10/2019   Lab Results  Component Value Date   WBC 5.7 11/10/2019   HGB 11.5 (A) 11/10/2019   HCT 36 11/10/2019   MCV 83 10/13/2019   PLT 203 11/10/2019   Lab Results   Component Value Date   ALT 12 09/09/2018   AST 14 09/09/2018   ALKPHOS 45 09/09/2018   BILITOT 0.2 09/09/2018     Review of Systems  Constitutional: Negative.  Negative for chills, diaphoresis, fatigue, fever and unexpected weight change.  HENT: Positive for congestion, sinus pressure and sneezing. Negative for ear discharge, ear pain, hoarse voice, rhinorrhea and sore throat.   Eyes: Negative for photophobia, pain, discharge, redness and itching.  Respiratory: Negative for cough, shortness of breath, wheezing and stridor.   Cardiovascular: Negative for chest pain.  Gastrointestinal: Negative for abdominal pain, blood in stool, constipation, diarrhea, nausea and vomiting.  Endocrine: Negative for cold intolerance, heat intolerance, polydipsia, polyphagia and polyuria.  Genitourinary: Negative for dysuria, flank pain, frequency, hematuria, menstrual problem, pelvic pain, urgency, vaginal bleeding and vaginal discharge.  Musculoskeletal: Positive for neck pain. Negative for arthralgias, back pain and myalgias.  Skin: Negative for rash.  Allergic/Immunologic: Negative for environmental allergies and food allergies.  Neurological: Negative for dizziness, weakness, light-headedness, numbness and headaches.  Hematological: Negative for adenopathy. Does not bruise/bleed easily.  Psychiatric/Behavioral: Negative for dysphoric mood. The patient is not nervous/anxious.     Patient Active Problem List   Diagnosis Date Noted  . Screening for colon cancer   . Varicose veins of left lower extremity with inflammation 04/14/2018  . Bilateral lower extremity edema 10/07/2017  . History of total left knee replacement (TKR) 10/07/2017    No Known Allergies  Past Surgical History:  Procedure Laterality Date  . CESAREAN SECTION     x 2  . COLONOSCOPY WITH PROPOFOL N/A 05/09/2020   Procedure: COLONOSCOPY WITH PROPOFOL;  Surgeon: Pasty Spillers, MD;  Location: [facility];  Service:  Endoscopy;  Laterality: N/A;  . JOINT REPLACEMENT Left 04/2017   @ [facility] [place]  . KNEE CLOSED REDUCTION Left 05/28/2017   Procedure: CLOSED MANIPULATION KNEE;  Surgeon: Erin Sons, MD;  Location: [facility];  Service: Orthopedics;  Laterality: Left;  [facility] VARICOSE VEIN SURGERY      Social History   Tobacco Use  . Smoking status: Never Smoker  . Smokeless tobacco: Never Used  Vaping Use  . Vaping Use: Never used  Substance Use Topics  . Alcohol use: Yes    Alcohol/week: 0.0 standard drinks    Comment: RARE (2x/month)  . Drug use: No     Medication list has been reviewed and updated.  Current Meds  Medication Sig  . naproxen (NAPROSYN) 500 MG tablet Take by mouth.    PHQ 2/9 Scores 07/03/2020 04/19/2020 12/15/2019 10/13/2019  PHQ - 2 Score 0 0 0 0  PHQ- 9 Score 0 0 0 0    GAD 7 : Generalized Anxiety Score 07/03/2020 04/19/2020 12/15/2019 10/13/2019  Nervous, Anxious, on Edge 0 0 0 0  Control/stop worrying 0 0 0 0  Worry too much - different things 0 0 0 0  Trouble relaxing 0 0 0 0  Restless 0 0 0 0  Easily annoyed or irritable 0 0 0 0  Afraid - awful might happen 0 0 0 0  Total GAD 7 Score 0 0 0 0    BP Readings from Last 3 Encounters:  07/03/20 118/88  05/09/20 113/77  04/19/20 120/80    Physical Exam Vitals and nursing note reviewed.  Constitutional:      Appearance: She is well-developed.  HENT:     Head: Normocephalic.     Right Ear: Tympanic membrane and external ear normal.     Left Ear: Tympanic membrane and external ear normal.     Nose: Nose normal.     Mouth/Throat:     Mouth: Mucous membranes are dry.  Eyes:     General: Lids are everted, no foreign bodies appreciated. No scleral icterus.       Left eye: No foreign body or hordeolum.     Conjunctiva/sclera: Conjunctivae normal.     Right eye: Right conjunctiva is not injected.     Left eye: Left conjunctiva is not injected.     Pupils: Pupils are equal, round, and reactive to  light.  Neck:     Thyroid: No thyromegaly.     Vascular: No JVD.     Trachea: No tracheal deviation.  Cardiovascular:     Rate and Rhythm: Normal rate and regular rhythm.     Heart sounds: Normal heart sounds. No murmur heard. No friction rub. No gallop.   Pulmonary:     Effort: Pulmonary effort is normal. No respiratory distress.     Breath sounds: Normal breath sounds. No wheezing or rales.  Abdominal:     General: Bowel sounds are normal.     Palpations: Abdomen is soft. There is no mass.     Tenderness: There is no abdominal tenderness. There is no guarding or rebound.  Musculoskeletal:        General: No tenderness. Normal range of motion.     Cervical back: Normal range  of motion and neck supple.  Lymphadenopathy:     Cervical: No cervical adenopathy.  Skin:    General: Skin is warm.     Findings: No rash.  Neurological:     Mental Status: She is alert and oriented to person, place, and time.     Cranial Nerves: No cranial nerve deficit.     Deep Tendon Reflexes: Reflexes normal.  Psychiatric:        Mood and Affect: Mood is not anxious or depressed.     Wt Readings from Last 3 Encounters:  07/03/20 253 lb (114.8 kg)  05/09/20 247 lb (112 kg)  04/19/20 255 lb (115.7 kg)    BP 118/88   Pulse 72   Temp 99 F (37.2 C) (Oral)   Ht 5\' 2"  (1.575 m)   Wt 253 lb (114.8 kg)   SpO2 99%   BMI 46.27 kg/m   Assessment and Plan: 1. Cervical radiculopathy Chronic.  Persistent.  Stable.  Patient was noted on x-ray to have moderate C5-C6 disc and endplate degeneration in particular.  Pain is symptomatic with discomfort as well as radiating pain into her right arm as well as paresthesias.  We we have trialed prednisone and meloxicam but have not had significant results.  We will refer to orthopedics for evaluation and follow-up treatment. - Ambulatory referral to Orthopedic Surgery  2. Acute non-recurrent maxillary sinusitis Acute.  Persistent.  Stable.  Will initiate  amoxicillin 500 mg 3 times a day for 10 days. - amoxicillin (AMOXIL) 500 MG capsule; Take 1 capsule (500 mg total) by mouth 3 (three) times daily for 10 days.  Dispense: 30 capsule; Refill: 0  3. Cough Patient with cough and unable to sleep at night and we will prescribe Robitussin-AC 1 teaspoon every 6 hours as needed cough. - guaiFENesin-codeine (ROBITUSSIN AC) 100-10 MG/5ML syrup; Take 5 mLs by mouth 4 (four) times daily as needed for cough.  Dispense: 118 mL; Refill: 0

## 2020-08-24 ENCOUNTER — Other Ambulatory Visit: Payer: Self-pay | Admitting: Family Medicine

## 2020-08-24 DIAGNOSIS — M5412 Radiculopathy, cervical region: Secondary | ICD-10-CM

## 2020-09-06 ENCOUNTER — Encounter: Payer: Self-pay | Admitting: Family Medicine

## 2021-03-05 ENCOUNTER — Other Ambulatory Visit: Payer: Self-pay | Admitting: Family Medicine

## 2021-03-05 DIAGNOSIS — R059 Cough, unspecified: Secondary | ICD-10-CM

## 2021-07-10 ENCOUNTER — Ambulatory Visit (INDEPENDENT_AMBULATORY_CARE_PROVIDER_SITE_OTHER): Payer: Commercial Managed Care - PPO | Admitting: Family Medicine

## 2021-07-10 ENCOUNTER — Encounter: Payer: Self-pay | Admitting: Family Medicine

## 2021-07-10 VITALS — BP 122/86 | HR 78 | Ht 62.0 in | Wt 254.0 lb

## 2021-07-10 DIAGNOSIS — J3489 Other specified disorders of nose and nasal sinuses: Secondary | ICD-10-CM

## 2021-07-10 DIAGNOSIS — R5383 Other fatigue: Secondary | ICD-10-CM

## 2021-07-10 DIAGNOSIS — E860 Dehydration: Secondary | ICD-10-CM

## 2021-07-10 MED ORDER — MONTELUKAST SODIUM 10 MG PO TABS: 10.0000 mg | ORAL_TABLET | Freq: Every day | ORAL | 3 refills | Status: DC

## 2021-07-10 MED ORDER — TRIAMCINOLONE ACETONIDE 55 MCG/ACT NA AERO: 2.0000 | INHALATION_SPRAY | Freq: Every day | NASAL | 12 refills | Status: DC

## 2021-07-10 NOTE — Progress Notes (Signed)
? ? ?Date:  07/10/2021  ? ?Name:  Grace EllisSharon D Hennes   DOB:  Mar 28, 1965   MRN:  [id number] ? ? ?Chief Complaint: Fatigue (Feels like she has "brain fog"- tired, COVID negative x 3- headache) ? ?Sinus Problem ?This is a new problem. The current episode started yesterday. The problem has been waxing and waning since onset. There has been no fever. Her pain is at a severity of 6/10. The pain is mild. Associated symptoms include coughing, headaches and sinus pressure. Pertinent negatives include no chills, congestion, diaphoresis, ear pain, hoarse voice, neck pain, shortness of breath, sneezing, sore throat or swollen glands. Past treatments include nothing. The treatment provided mild relief.  ? ?Lab Results  ?Component Value Date  ? NA 139 11/10/2019  ? K 4.1 11/10/2019  ? CO2 23 10/13/2019  ? GLUCOSE 101 (H) 10/13/2019  ? BUN 17 11/10/2019  ? CREATININE 0.9 11/10/2019  ? CALCIUM 8.7 11/10/2019  ? GFRNONAA 84 10/13/2019  ? ?Lab Results  ?Component Value Date  ? CHOL 214 (H) 09/09/2018  ? HDL 62 09/09/2018  ? LDLCALC 142 (H) 09/09/2018  ? TRIG 50 09/09/2018  ? CHOLHDL 3.5 09/09/2018  ? ?No results found for: TSH ?Lab Results  ?Component Value Date  ? HGBA1C 5.9 11/10/2019  ? ?Lab Results  ?Component Value Date  ? WBC 5.7 11/10/2019  ? HGB 11.5 (A) 11/10/2019  ? HCT 36 11/10/2019  ? MCV 83 10/13/2019  ? PLT 203 11/10/2019  ? ?Lab Results  ?Component Value Date  ? ALT 12 09/09/2018  ? AST 14 09/09/2018  ? ALKPHOS 45 09/09/2018  ? BILITOT 0.2 09/09/2018  ? ?No results found for: 25OHVITD2, 25OHVITD3, VD25OH  ? ?Review of Systems  ?Constitutional:  Negative for chills, diaphoresis and fever.  ?HENT:  Positive for sinus pressure. Negative for congestion, drooling, ear discharge, ear pain, hoarse voice, sneezing and sore throat.   ?Respiratory:  Positive for cough. Negative for shortness of breath and wheezing.   ?Cardiovascular:  Negative for chest pain, palpitations and leg swelling.  ?Gastrointestinal:  Negative for  abdominal pain, blood in stool, constipation, diarrhea and nausea.  ?Endocrine: Negative for polydipsia.  ?Genitourinary:  Negative for dysuria, frequency, hematuria and urgency.  ?Musculoskeletal:  Negative for back pain, myalgias and neck pain.  ?Skin:  Negative for rash.  ?Allergic/Immunologic: Negative for environmental allergies.  ?Neurological:  Positive for headaches. Negative for dizziness.  ?Hematological:  Does not bruise/bleed easily.  ?Psychiatric/Behavioral:  Negative for suicidal ideas. The patient is not nervous/anxious.   ? ?Patient Active Problem List  ? Diagnosis Date Noted  ? Screening for colon cancer   ? Varicose veins of left lower extremity with inflammation 04/14/2018  ? Bilateral lower extremity edema 10/07/2017  ? History of total left knee replacement (TKR) 10/07/2017  ? ? ?No Known Allergies ? ?Past Surgical History:  ?Procedure Laterality Date  ? CESAREAN SECTION    ? x 2  ? COLONOSCOPY WITH PROPOFOL N/A 05/09/2020  ? Procedure: COLONOSCOPY WITH PROPOFOL;  Surgeon: Pasty Spillersahiliani, Varnita B, MD;  [facility];  Service: Endoscopy;  Laterality: N/A;  ? JOINT REPLACEMENT [facility] 04/2017  ? [facility] [place]  ? KNEE CLOSED REDUCTION Left 05/28/2017  ? Procedure: CLOSED MANIPULATION KNEE;  Surgeon: Erin SonsKernodle, Harold, MD;  Location: [facility];  Service: Orthopedics;  Laterality: Left;  ? VARICOSE VEIN SURGERY    ? ? ?Social History  ? ?Tobacco Use  ? Smoking status: Never  ? Smokeless tobacco:  Never  ?Vaping Use  ? Vaping Use: Never used  ?Substance Use Topics  ? Alcohol use: Yes  ?  Alcohol/week: 0.0 standard drinks  ?  Comment: RARE (2x/month)  ? Drug use: No  ? ? ? ?Medication list has been reviewed and updated. ? ?Current Meds  ?Medication Sig  ? meloxicam (MOBIC) 15 MG tablet Take 1 tablet (15 mg total) by mouth daily.  ? naproxen (NAPROSYN) 500 MG tablet Take by mouth.  ? ? ? ?  07/10/2021  ?  4:14 PM 07/03/2020  ?  1:55 PM 04/19/2020  ? 11:56 AM 12/15/2019  ? 10:42 AM   ?GAD 7 : Generalized Anxiety Score  ?Nervous, Anxious, on Edge 0 0 0 0  ?Control/stop worrying 0 0 0 0  ?Worry too much - different things 0 0 0 0  ?Trouble relaxing 1 0 0 0  ?Restless 0 0 0 0  ?Easily annoyed or irritable 1 0 0 0  ?Afraid - awful might happen 0 0 0 0  ?Total GAD 7 Score 2 0 0 0  ?Anxiety Difficulty Not difficult at all     ? ? ? ?  07/10/2021  ?  4:14 PM  ?Depression screen PHQ 2/9  ?Decreased Interest 1  ?Down, Depressed, Hopeless 0  ?PHQ - 2 Score 1  ?Altered sleeping 3  ?Tired, decreased energy 2  ?Change in appetite 0  ?Feeling bad or failure about yourself  0  ?Trouble concentrating 2  ?Moving slowly or fidgety/restless 0  ?Suicidal thoughts 0  ?PHQ-9 Score 8  ?Difficult doing work/chores Somewhat difficult  ? ? ?BP Readings from Last 3 Encounters:  ?07/10/21 122/86  ?07/03/20 118/88  ?05/09/20 113/77  ? ? ?Physical Exam ?Vitals and nursing note reviewed. Exam conducted with a chaperone present.  ?Constitutional:   ?   General: She is not in acute distress. ?   Appearance: She is not diaphoretic.  ?HENT:  ?   Head: Normocephalic and atraumatic.  ?   Right Ear: Tympanic membrane and external ear normal. There is no impacted cerumen.  ?   Left Ear: Tympanic membrane and external ear normal. There is no impacted cerumen.  ?   Nose: Nose normal. No congestion or rhinorrhea.  ?   Mouth/Throat:  ?   Mouth: Mucous membranes are moist.  ?   Pharynx: No oropharyngeal exudate or posterior oropharyngeal erythema.  ?Eyes:  ?   General:     ?   Right eye: No discharge.     ?   Left eye: No discharge.  ?   Conjunctiva/sclera: Conjunctivae normal.  ?   Pupils: Pupils are equal, round, and reactive to light.  ?Neck:  ?   Thyroid: No thyromegaly.  ?   Vascular: No JVD.  ?Cardiovascular:  ?   Rate and Rhythm: Normal rate and regular rhythm.  ?   Heart sounds: Normal heart sounds. No murmur heard. ?  No friction rub. No gallop.  ?Pulmonary:  ?   Effort: Pulmonary effort is normal.  ?   Breath sounds: Normal  breath sounds. No wheezing or rhonchi.  ?Abdominal:  ?   General: Bowel sounds are normal.  ?   Palpations: Abdomen is soft. There is no mass.  ?   Tenderness: There is no abdominal tenderness. There is no guarding.  ?Musculoskeletal:     ?   General: Normal range of motion.  ?   Cervical back: Normal range of motion and neck supple.  ?Lymphadenopathy:  ?  Cervical: No cervical adenopathy.  ?Skin: ?   General: Skin is warm and dry.  ?Neurological:  ?   Mental Status: She is alert.  ?   Deep Tendon Reflexes: Reflexes are normal and symmetric.  ? ? ?Wt Readings from Last 3 Encounters:  ?07/10/21 254 lb (115.2 kg)  ?07/03/20 253 lb (114.8 kg)  ?05/09/20 247 lb (112 kg)  ? ? ?BP 122/86   Pulse 78   Ht 5\' 2"  (1.575 m)   Wt 254 lb (115.2 kg)   BMI 46.46 kg/m?  ? ?Assessment and Plan: ? ?1. Sinus pressure ?New onset.  Persistent.  Episodic.  There is no fever and no sinus congestion associated with this.  I still think that this is allergy related and we will have a trial of Singulair 10 mg and Nasacort 2 sprays in each nostril daily. ?- montelukast (SINGULAIR) 10 MG tablet; Take 1 tablet (10 mg total) by mouth at bedtime.  Dispense: 30 tablet; Refill: 3 ?- triamcinolone (NASACORT) 55 MCG/ACT AERO nasal inhaler; Place 2 sprays into the nose daily.  Dispense: 1 each; Refill: 12 ? ?2. Other fatigue ?Other components of this is that there is fatigue and this is been a longer standing nature.  We will check a CBC renal function panel and thyroid panel for rule out causative concerns. ?- CBC ?- Renal Function Panel ?- Thyroid Panel With TSH ? ?3. Dehydration ?Mucous membranes are decreased this is an acute process manifestation which is probably gradually developing.  Patient's been encouraged to rehydrate perhaps with a electrolyte liquid and will recheck as needed if continued concerns. ? ? ?

## 2021-07-12 LAB — RENAL FUNCTION PANEL
Albumin: 4.5 g/dL (ref 3.8–4.9)
BUN/Creatinine Ratio: 23 (ref 9–23)
BUN: 15 mg/dL (ref 6–24)
CO2: 24 mmol/L (ref 20–29)
Calcium: 9.5 mg/dL (ref 8.7–10.2)
Chloride: 103 mmol/L (ref 96–106)
Creatinine, Ser: 0.66 mg/dL (ref 0.57–1.00)
Glucose: 109 mg/dL — ABNORMAL HIGH (ref 70–99)
Phosphorus: 3.7 mg/dL (ref 3.0–4.3)
Potassium: 4.3 mmol/L (ref 3.5–5.2)
Sodium: 141 mmol/L (ref 134–144)
eGFR: 105 mL/min/{1.73_m2} (ref >59)

## 2021-07-12 LAB — CBC
Hematocrit: 41.2 % (ref 34.0–46.6)
Hemoglobin: 13.6 g/dL (ref 11.1–15.9)
MCH: 29.1 pg (ref 26.6–33.0)
MCHC: 33 g/dL (ref 31.5–35.7)
MCV: 88 fL (ref 79–97)
Platelets: 198 10*3/uL (ref 150–450)
RBC: 4.67 x10E6/uL (ref 3.77–5.28)
RDW: 13.8 % (ref 11.7–15.4)
WBC: 5 10*3/uL (ref 3.4–10.8)

## 2021-07-12 LAB — THYROID PANEL WITH TSH
Free Thyroxine Index: 2 (ref 1.2–4.9)
T3 Uptake Ratio: 31 % (ref 24–39)
T4, Total: 6.4 ug/dL (ref 4.5–12.0)
TSH: 1.02 u[IU]/mL (ref 0.450–4.500)

## 2021-11-20 ENCOUNTER — Telehealth: Payer: Self-pay | Admitting: Family Medicine

## 2021-11-20 NOTE — Telephone Encounter (Signed)
Copied from [place] [phone]. Topic: Appointment Scheduling - Scheduling Inquiry for Clinic >> Nov 20, 2021  3:37 PM Grace Harris wrote: Reason for CRM: pt would like to know if Dr Ronnald Ramp can see her daughter Grace Harris as a new pt asap.  Grace Harris was told at the center where she gets her [facility] that her bp is sky high and she needs a pcp. My first available to schedule new pt is Jan.  [name] would like Baxter Flattery to call her.

## 2022-02-06 ENCOUNTER — Encounter: Payer: Self-pay | Admitting: Family Medicine

## 2022-02-06 ENCOUNTER — Ambulatory Visit (INDEPENDENT_AMBULATORY_CARE_PROVIDER_SITE_OTHER): Payer: Commercial Managed Care - PPO | Admitting: Family Medicine

## 2022-02-06 VITALS — BP 100/64 | HR 74 | Ht 62.0 in | Wt 239.0 lb

## 2022-02-06 DIAGNOSIS — L03116 Cellulitis of left lower limb: Secondary | ICD-10-CM

## 2022-02-06 DIAGNOSIS — I872 Venous insufficiency (chronic) (peripheral): Secondary | ICD-10-CM

## 2022-02-06 MED ORDER — AMOXICILLIN-POT CLAVULANATE 875-125 MG PO TABS
1.0000 | ORAL_TABLET | Freq: Two times a day (BID) | ORAL | 0 refills | Status: DC
Start: 2022-02-06 — End: 2022-04-10

## 2022-02-06 NOTE — Progress Notes (Signed)
Date:  02/06/2022   Name:  Grace Harris   DOB:  [birth year]   MRN:  [id number]   Chief Complaint: Rash (Has had hx of cellulitis on lower extremity- has a place that started on LLL yesterday that is red, warm and sore)  Leg Pain  The incident occurred 2 days ago. There was no injury mechanism. Pain location: left lower leg. The pain is moderate. The pain has been Constant since onset. Pertinent negatives include no inability to bear weight, numbness or tingling. The symptoms are aggravated by movement, palpation and weight bearing.    Lab Results  Component Value Date   NA 141 07/11/2021   K 4.3 07/11/2021   CO2 24 07/11/2021   GLUCOSE 109 (H) 07/11/2021   BUN 15 07/11/2021   CREATININE 0.66 07/11/2021   CALCIUM 9.5 07/11/2021   EGFR 105 07/11/2021   GFRNONAA 84 10/13/2019   Lab Results  Component Value Date   CHOL 214 (H) 09/09/2018   HDL 62 09/09/2018   LDLCALC 142 (H) 09/09/2018   TRIG 50 09/09/2018   CHOLHDL 3.5 09/09/2018   Lab Results  Component Value Date   TSH 1.020 07/11/2021   Lab Results  Component Value Date   HGBA1C 5.9 11/10/2019   Lab Results  Component Value Date   WBC 5.0 07/11/2021   HGB 13.6 07/11/2021   HCT 41.2 07/11/2021   MCV 88 07/11/2021   PLT 198 07/11/2021   Lab Results  Component Value Date   ALT 12 09/09/2018   AST 14 09/09/2018   ALKPHOS 45 09/09/2018   BILITOT 0.2 09/09/2018   No results found for: "25OHVITD2", "25OHVITD3", "VD25OH"   Review of Systems  Constitutional:  Negative for chills and fever.  Respiratory:  Negative for shortness of breath.   Cardiovascular:  Negative for chest pain and palpitations.  Skin:  Positive for color change.  Neurological:  Negative for tingling and numbness.  Hematological:  Negative for adenopathy.    Patient Active Problem List   Diagnosis Date Noted   Screening for colon cancer    Varicose veins of left lower extremity with inflammation 04/14/2018   Bilateral lower  extremity edema 10/07/2017   History of total left knee replacement (TKR) 10/07/2017    No Known Allergies  Past Surgical History:  Procedure Laterality Date   CESAREAN SECTION     x 2   COLONOSCOPY WITH PROPOFOL N/A 05/09/2020   Procedure: COLONOSCOPY WITH PROPOFOL;  Surgeon: Virgel Manifold, MD;  Location: [place];  Service: Endoscopy;  Laterality: N/A;   JOINT REPLACEMENT Left 04/2017   @ [place] Left 05/28/2017   Procedure: CLOSED MANIPULATION KNEE;  Surgeon: Leanor Kail, MD;  Location: [facility];  Service: Orthopedics;  Laterality: Left;   VARICOSE VEIN SURGERY      Social History   Tobacco Use   Smoking status: Never   Smokeless tobacco: Never  Vaping Use   Vaping Use: Never used  Substance Use Topics   Alcohol use: Yes    Alcohol/week: 0.0 standard drinks of alcohol    Comment: RARE (2x/month)   Drug use: No     Medication list has been reviewed and updated.  Current Meds  Medication Sig   meloxicam (MOBIC) 15 MG tablet Take 1 tablet (15 mg total) by mouth daily.       07/10/2021    4:14 PM 07/03/2020    1:55 PM 04/19/2020  11:56 AM 12/15/2019   10:42 AM  GAD 7 : Generalized Anxiety Score  Nervous, Anxious, on Edge 0 0 0 0  Control/stop worrying 0 0 0 0  Worry too much - different things 0 0 0 0  Trouble relaxing 1 0 0 0  Restless 0 0 0 0  Easily annoyed or irritable 1 0 0 0  Afraid - awful might happen 0 0 0 0  Total GAD 7 Score 2 0 0 0  Anxiety Difficulty Not difficult at all          07/10/2021    4:14 PM 07/03/2020    1:55 PM 04/19/2020   11:56 AM  Depression screen PHQ 2/9  Decreased Interest 1 0 0  Down, Depressed, Hopeless 0 0 0  PHQ - 2 Score 1 0 0  Altered sleeping 3 0 0  Tired, decreased energy 2 0 0  Change in appetite 0 0 0  Feeling bad or failure about yourself  0 0 0  Trouble concentrating 2 0 0  Moving slowly or fidgety/restless 0 0 0  Suicidal thoughts 0 0 0   PHQ-9 Score 8 0 0  Difficult doing work/chores Somewhat difficult      BP Readings from Last 3 Encounters:  02/06/22 100/64  07/10/21 122/86  07/03/20 118/88    Physical Exam Vitals and nursing note reviewed. Exam conducted with a chaperone present.  Constitutional:      General: She is not in acute distress.    Appearance: She is not diaphoretic.  HENT:     Head: Normocephalic and atraumatic.     Right Ear: Tympanic membrane and external ear normal.     Left Ear: Tympanic membrane and external ear normal.     Nose: Nose normal.     Mouth/Throat:     Mouth: Mucous membranes are moist.  Eyes:     General:        Right eye: No discharge.        Left eye: No discharge.     Conjunctiva/sclera: Conjunctivae normal.     Pupils: Pupils are equal, round, and reactive to light.  Neck:     Thyroid: No thyromegaly.     Vascular: No JVD.  Cardiovascular:     Rate and Rhythm: Normal rate and regular rhythm.     Heart sounds: Normal heart sounds. No murmur heard.    No friction rub. No gallop.  Pulmonary:     Effort: Pulmonary effort is normal.     Breath sounds: Normal breath sounds. No wheezing or rhonchi.  Abdominal:     General: Bowel sounds are normal.     Palpations: Abdomen is soft. There is no mass.     Tenderness: There is no abdominal tenderness. There is no guarding.  Musculoskeletal:        General: Normal range of motion.     Cervical back: Normal range of motion and neck supple.  Lymphadenopathy:     Cervical: No cervical adenopathy.  Skin:    General: Skin is warm and dry.  Neurological:     Mental Status: She is alert.     Wt Readings from Last 3 Encounters:  02/06/22 239 lb (108.4 kg)  07/10/21 254 lb (115.2 kg)  07/03/20 253 lb (114.8 kg)    BP 100/64   Pulse 74   Ht _0  (1.575 m)   Wt 239 lb (108.4 kg)   SpO2 97%   BMI 43.71 kg/m   Assessment  and Plan:  1. Cellulitis of left lower extremity New onset.  Persistent.  Has had this in the  past so this is a recurrence but upper remote nature.  Exam and history is consistent with cellulitis.  We will treat with Augmentin 875 mg twice a day for 10 days patient has been instructed to also elevate legs for the next 24 to 48 hours. - amoxicillin-clavulanate (AUGMENTIN) 875-125 MG tablet; Take 1 tablet by mouth 2 (two) times daily.  Dispense: 20 tablet; Refill: 0  2. Venous insufficiency Patient has history of venous insufficiency in which she has been seen by vein and vascular in the past.  At this point in time she does not wish to see them again but if this is to recur or persist then I will insist that we will proceed in this direction.   Otilio Miu, MD

## 2022-02-08 ENCOUNTER — Ambulatory Visit (INDEPENDENT_AMBULATORY_CARE_PROVIDER_SITE_OTHER): Payer: Commercial Managed Care - PPO | Admitting: Family Medicine

## 2022-02-08 ENCOUNTER — Encounter: Payer: Self-pay | Admitting: Family Medicine

## 2022-02-08 ENCOUNTER — Inpatient Hospital Stay: Payer: Self-pay | Admitting: Radiology

## 2022-02-08 VITALS — BP 100/78 | HR 66 | Ht 62.0 in | Wt 238.0 lb

## 2022-02-08 DIAGNOSIS — M19042 Primary osteoarthritis, left hand: Secondary | ICD-10-CM

## 2022-02-08 DIAGNOSIS — M65312 Trigger thumb, left thumb: Secondary | ICD-10-CM

## 2022-02-08 MED ORDER — TRIAMCINOLONE ACETONIDE 40 MG/ML IJ SUSP
20.0000 mg | Freq: Once | INTRAMUSCULAR | Status: AC
  Administered 2022-02-08: 20 mg via INTRAMUSCULAR

## 2022-02-08 MED ORDER — MELOXICAM 15 MG PO TABS: 15.0000 mg | ORAL_TABLET | Freq: Every day | ORAL | 0 refills | Status: AC | PRN

## 2022-02-08 NOTE — Patient Instructions (Signed)
You have just been given a cortisone injection to reduce pain and inflammation. After the injection you may notice immediate relief of pain as a result of the Lidocaine. It is important to rest the area of the injection for 24 to 48 hours after the injection. There is a possibility of some temporary increased discomfort and swelling for up to 72 hours until the cortisone begins to work. If you do have pain, simply rest the joint and use ice. If you can tolerate over the counter medications, you can try Tylenol, Aleve, or Advil for added relief per package instructions. - Use meloxicam as-needed until symptoms respond to cortisone - Start home exercises next week and continue regularly - Contact us at 2 weeks or beyond for any persistent symptoms - Follow-up as-needed

## 2022-02-10 DIAGNOSIS — M19042 Primary osteoarthritis, left hand: Secondary | ICD-10-CM | POA: Insufficient documentation

## 2022-02-10 NOTE — Progress Notes (Signed)
     Primary Care / Sports Medicine Office Visit  Patient Information:  Patient ID: Grace Harris, female DOB: 1965/11/08 Age: 56 y.o. MRN: [id number]   Grace Harris is a pleasant 56 y.o. female presenting with the following:  Chief Complaint  Patient presents with   Hand Pain    Left thumb, for about 2 months, Left handed. Trigger thumb. Has been wearing a thumb brace and wrist brace.     Vitals:   02/08/22 0817  BP: 100/78  Pulse: 66  SpO2: 99%   Vitals:   02/08/22 0817  Weight: 238 lb (108 kg)  Height: 5\' 2"  (1.575 m)   Body mass index is 43.53 kg/m.  No results found.   Independent interpretation of notes and tests performed by another provider:   None  Procedures performed:   Procedure:  Injection of left 1st CMC under ultrasound guidance. Ultrasound guidance utilized for out-of-plane approach to joint, cortical roughening consistent with osteoarthritis noted, no effusion Samsung HS60 device utilized with permanent recording / reporting. Verbal informed consent obtained and verified. Skin prepped in a sterile fashion. Ethyl chloride for topical local analgesia.  Completed without difficulty and tolerated well. Medication: triamcinolone acetonide 40 mg/mL suspension for injection 0.5 mL total and 0.25 mL lidocaine 1% without epinephrine utilized for needle placement anesthetic Advised to contact for fevers/chills, erythema, induration, drainage, or persistent bleeding.   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 Has been using meloxicam without benefit.   Examination consistent with 1st CMC OA with focal tenderness about the joint, no crepitus, no swelling, nontender at MCP, no  triggering with active / passive ROM.  Given treatments to date, discussed additional treatments, patient requests corticosteroid injection, we performed this under ultrasound guidance. From a medication management standpoint I have advised continued meloxicam until symptoms respond to cortisone. Home exercises provided and she can follow-up as-needed.      Relevant Medications   meloxicam (MOBIC) 15 MG tablet   Other Relevant Orders   LIMITED JOINT SPACE STRUCTURES LOW LEFT     Orders & Medications Meds ordered this encounter  Medications   meloxicam (MOBIC) 15 MG tablet    Sig: Take 1 tablet (15 mg total) by mouth daily as needed for pain.    Dispense:  30 tablet    Refill:  0   triamcinolone acetonide (KENALOG-40) injection 20 mg   Orders Placed This Encounter  Procedures   [place] LIMITED JOINT SPACE STRUCTURES LOW LEFT     No follow-ups on file.     [place], MD, [facility]   Primary Care Sports Medicine Primary Care and Sports Medicine at [facility]

## 2022-02-10 NOTE — Assessment & Plan Note (Addendum)
Acute on chronic left thumb pain, no triggering, aches, interferes with ADLs and work. Has been using meloxicam without benefit.   Examination consistent with 1st CMC OA with focal tenderness about the joint, no crepitus, no swelling, nontender at MCP, no triggering with active / passive ROM.  Given treatments to date, discussed additional treatments, patient requests corticosteroid injection, we performed this under ultrasound guidance. From a medication management standpoint I have advised continued meloxicam until symptoms respond to cortisone. Home exercises provided and she can follow-up as-needed.

## 2022-04-01 ENCOUNTER — Other Ambulatory Visit: Payer: Self-pay | Admitting: Family Medicine

## 2022-04-01 DIAGNOSIS — M19042 Primary osteoarthritis, left hand: Secondary | ICD-10-CM

## 2022-04-02 ENCOUNTER — Telehealth: Payer: Self-pay | Admitting: Family Medicine

## 2022-04-02 ENCOUNTER — Other Ambulatory Visit: Payer: Self-pay | Admitting: Family Medicine

## 2022-04-02 DIAGNOSIS — M19042 Primary osteoarthritis, left hand: Secondary | ICD-10-CM

## 2022-04-02 NOTE — Telephone Encounter (Signed)
Copied from [place] [phone]. Topic: General - Other >> Apr 02, 2022  2:25 PM Oley Balm A wrote: Reason for CRM: Patient is wanting to see if Baxter Flattery can call her back. Per pt she sent a mychart request for meloxicam (MOBIC) 15 MG tablet and Dr. Zigmund Daniel denied it but she was trying to send it to her PCP Otilio Miu.   Could Baxter Flattery please call the pt back.

## 2022-04-02 NOTE — Telephone Encounter (Signed)
Grace Harris Could you inform pt we will send it in for PRN, give 30 pills no refills

## 2022-04-02 NOTE — Telephone Encounter (Signed)
Please advise 

## 2022-04-03 NOTE — Telephone Encounter (Signed)
Unable to refill per protocol, no longer under prescriber care. Will refuse.  Requested Prescriptions  Pending Prescriptions Disp Refills   meloxicam (MOBIC) 15 MG tablet [Pharmacy Med Name: Meloxicam 15 MG Oral Tablet] 30 tablet 0    Sig: TAKE 1 TABLET BY MOUTH ONCE DAILY AS NEEDED FOR PAIN     Analgesics:  COX2 Inhibitors Failed - 04/02/2022  2:19 PM      Failed - Manual Review: Labs are only required if the patient has taken medication for more than 8 weeks.      Failed - AST in normal range and within 360 days    AST  Date Value Ref Range Status  09/09/2018 14 0 - 40 IU/L Final         Failed - ALT in normal range and within 360 days    ALT  Date Value Ref Range Status  09/09/2018 12 0 - 32 IU/L Final         Passed - HGB in normal range and within 360 days    Hemoglobin  Date Value Ref Range Status  07/11/2021 13.6 11.1 - 15.9 g/dL Final         Passed - Cr in normal range and within 360 days    Creatinine, Ser  Date Value Ref Range Status  07/11/2021 0.66 0.57 - 1.00 mg/dL Final         Passed - HCT in normal range and within 360 days    Hematocrit  Date Value Ref Range Status  07/11/2021 41.2 34.0 - 46.6 % Final         Passed - eGFR is 30 or above and within 360 days    GFR calc Af Amer  Date Value Ref Range Status  10/13/2019 97 >59 mL/min/1.73 Final    Comment:    **[facility] currently reports eGFR in compliance with the current**   recommendations of the [employer]. [facility] will   update reporting as new guidelines are published from the NKF-ASN   Task force.    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

## 2022-04-10 ENCOUNTER — Telehealth: Payer: Self-pay

## 2022-04-10 NOTE — Telephone Encounter (Signed)
Transition Care Management Follow-up Telephone Call Date of discharge and from where: 02/07/2024[facility] How have you been since you were released from the hospital? "Been okay" Any questions or concerns? No  Items Reviewed: Did the pt receive and understand the discharge instructions provided? Yes  Medications obtained and verified? Yes  Other? No  Any new allergies since your discharge? No  Dietary orders reviewed? Yes Do you have support at home? Yes   Home Care and Equipment/Supplies: none  Functional Questionnaire: (I = Independent and D = Dependent) ADLs: I  Bathing/Dressing- I  Meal Prep- I  Eating- I  Maintaining continence- I  Transferring/Ambulation- I  Managing Meds- I  Follow up appointments reviewed:  PCP Hospital f/u appt confirmed? Work dilemma/ will follow up with vein and vascular [facility] f/u appt confirmed? Pt will call with appt Are transportation arrangements needed? No  If their condition worsens, is the pt aware to call PCP or go to the Emergency Dept.? Yes Was the patient provided with contact information for the PCP's office or ED? Yes Was to pt encouraged to call back with questions or concerns? Yes

## 2022-04-11 ENCOUNTER — Other Ambulatory Visit: Payer: Self-pay

## 2022-04-11 ENCOUNTER — Telehealth: Payer: Self-pay

## 2022-04-11 DIAGNOSIS — I872 Venous insufficiency (chronic) (peripheral): Secondary | ICD-10-CM

## 2022-04-11 DIAGNOSIS — L03116 Cellulitis of left lower limb: Secondary | ICD-10-CM

## 2022-04-11 NOTE — Progress Notes (Signed)
Ref to V and V

## 2022-04-11 NOTE — Telephone Encounter (Signed)
Spoke with pt on phone- placed ref to vein and vasc

## 2022-04-30 ENCOUNTER — Ambulatory Visit (INDEPENDENT_AMBULATORY_CARE_PROVIDER_SITE_OTHER): Payer: Self-pay | Admitting: Vascular Surgery

## 2022-07-18 ENCOUNTER — Other Ambulatory Visit (INDEPENDENT_AMBULATORY_CARE_PROVIDER_SITE_OTHER): Payer: Self-pay | Admitting: Nurse Practitioner

## 2022-07-18 DIAGNOSIS — I872 Venous insufficiency (chronic) (peripheral): Secondary | ICD-10-CM

## 2022-07-18 DIAGNOSIS — L03116 Cellulitis of left lower limb: Secondary | ICD-10-CM

## 2022-07-23 ENCOUNTER — Encounter (INDEPENDENT_AMBULATORY_CARE_PROVIDER_SITE_OTHER): Payer: Self-pay

## 2022-07-23 ENCOUNTER — Encounter (INDEPENDENT_AMBULATORY_CARE_PROVIDER_SITE_OTHER): Payer: Self-pay | Admitting: Vascular Surgery

## 2022-09-19 ENCOUNTER — Encounter: Payer: Self-pay | Admitting: Family Medicine

## 2022-10-10 IMAGING — CR DG CERVICAL SPINE COMPLETE 4+V
6 series · 6 of 6 positions shown · non-contrast
Comparison: None.

CLINICAL DATA: 54-year-old female with right arm paresthesia for 2
months.

EXAM:
CERVICAL SPINE - COMPLETE 4+ VIEW

[c-spine lat]
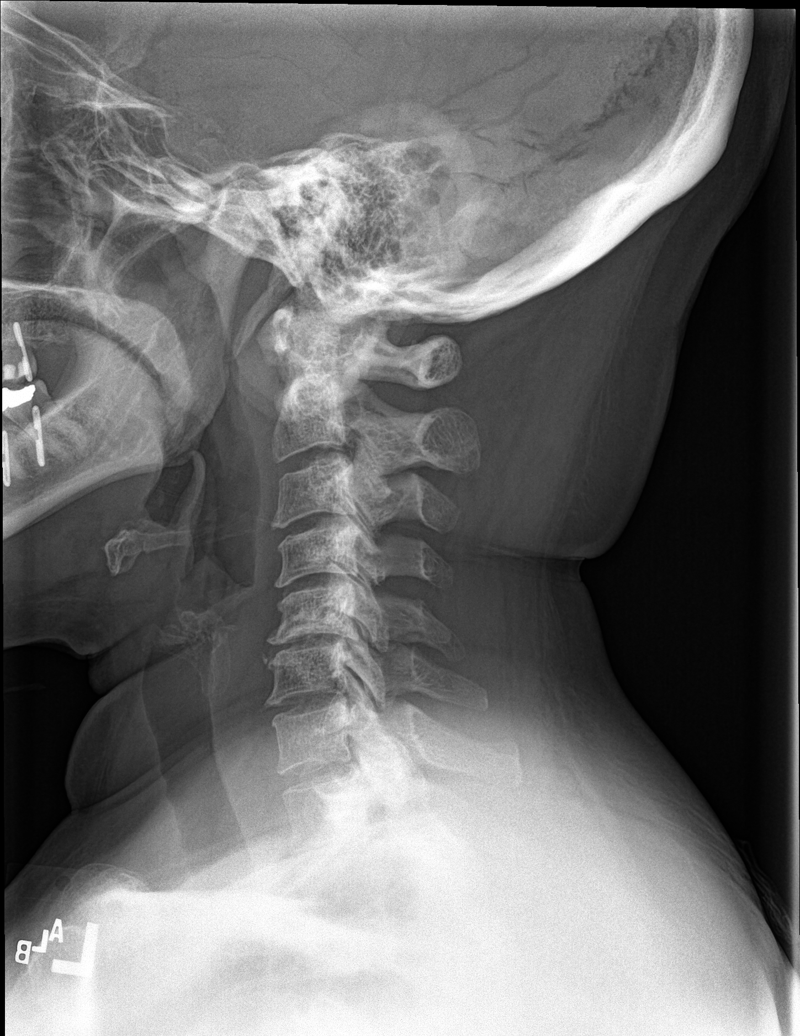

[c-spine obl (1 of 2)]
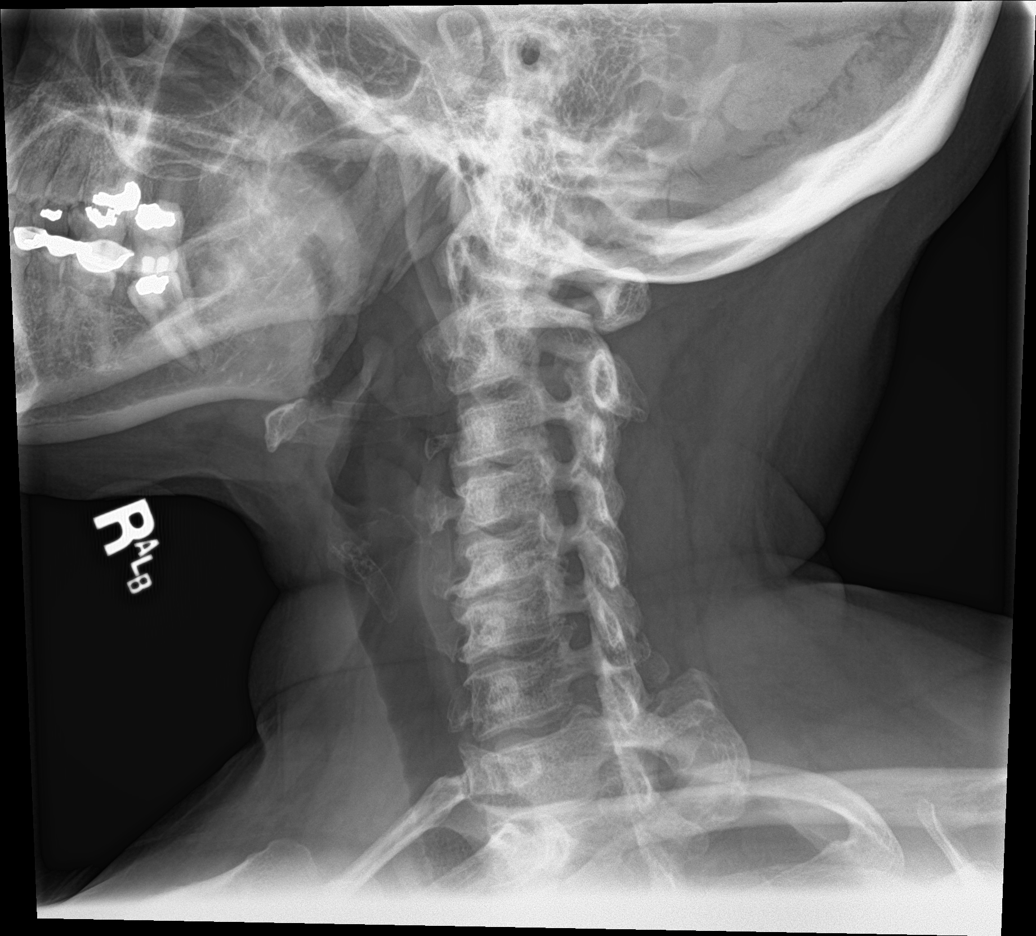

[c-spine obl (2 of 2)]
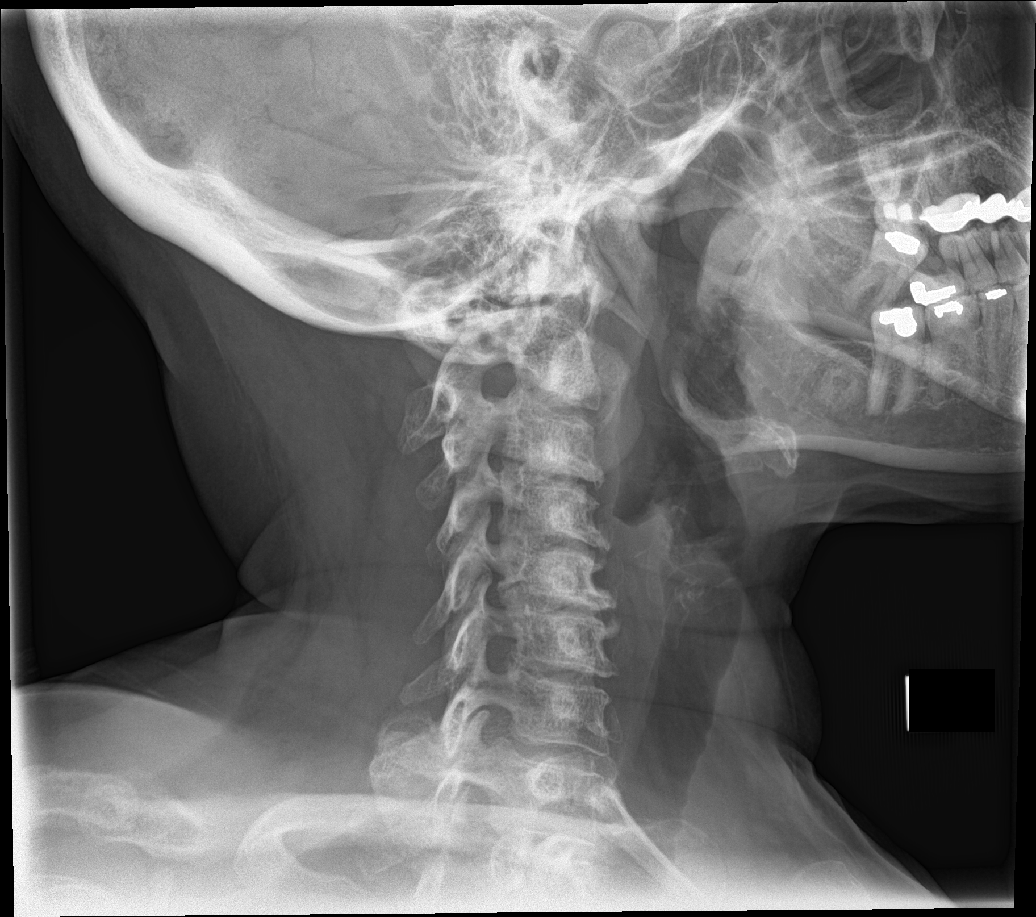

[c-spine ap]
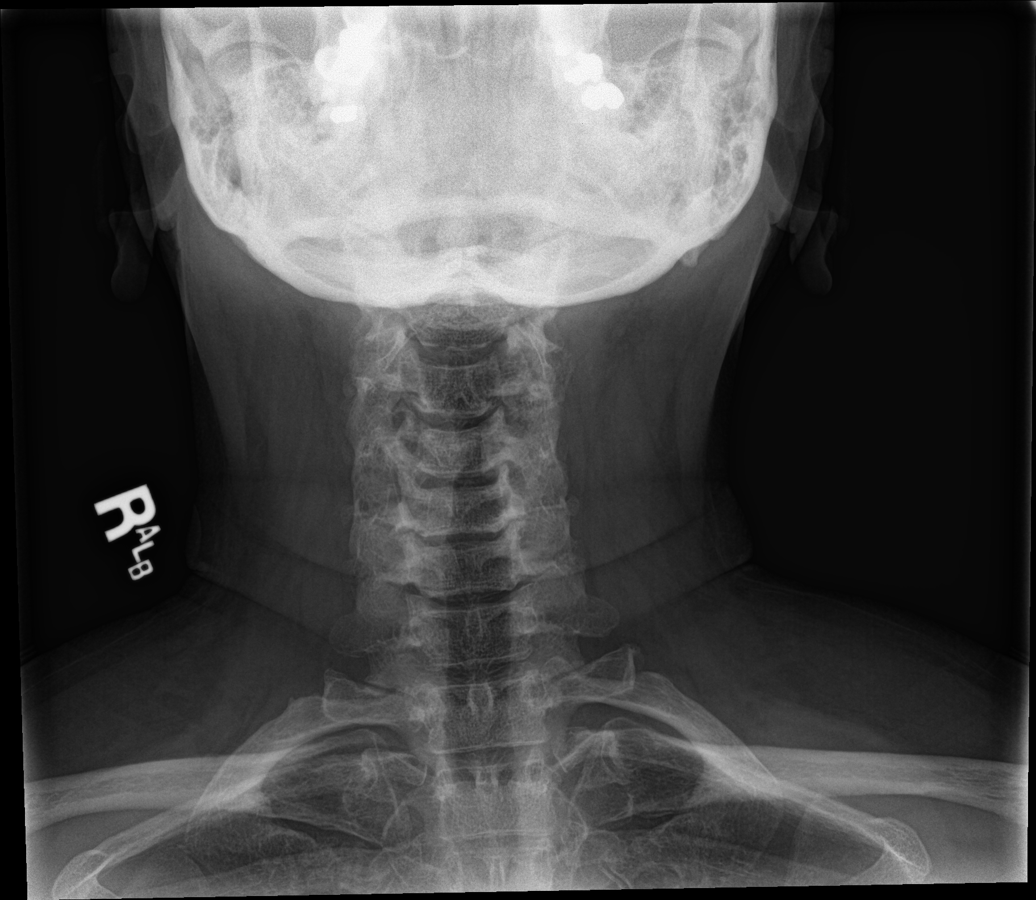

[c-spine open mouth]
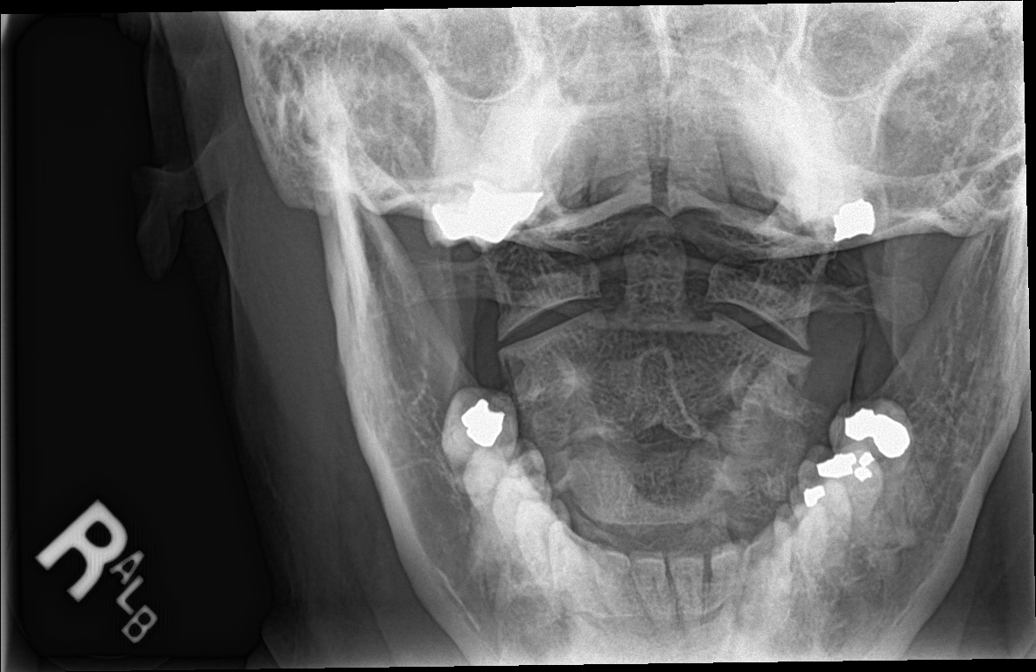

[[person_name]]
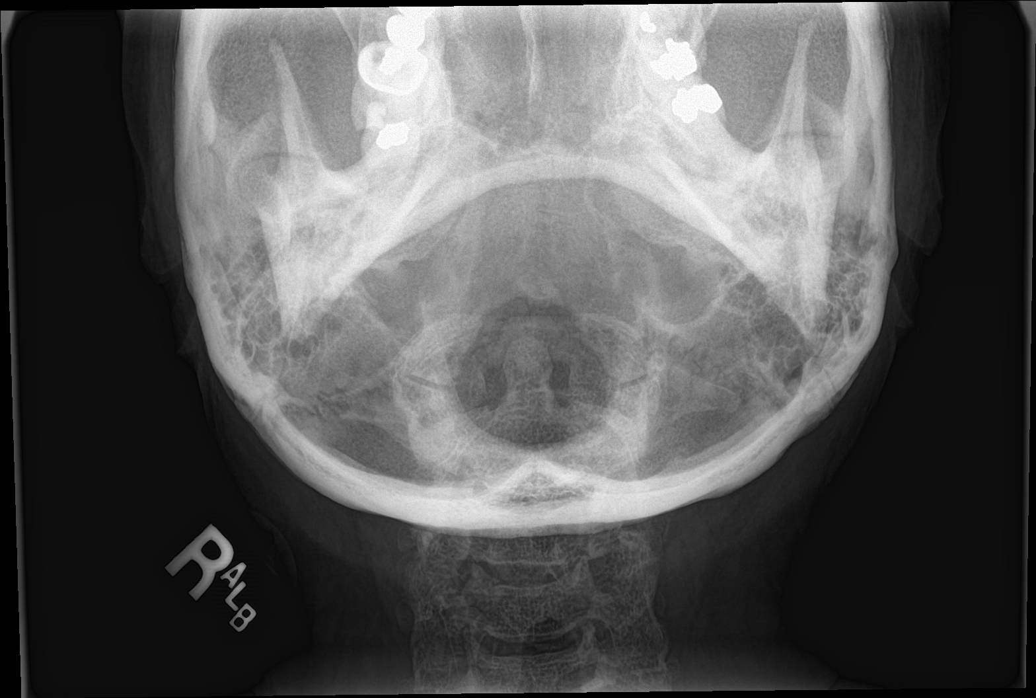

[6 of 6 positions shown; findings below may reference images not displayed]

FINDINGS: Normal prevertebral soft tissue contour. Mild straightening and
reversal of cervical lordosis. Cervicothoracic junction alignment is
within normal limits. Normal C1-C2 alignment and joint spaces.
Bilateral posterior element alignment is within normal limits.

No acute osseous abnormality identified. No facet hypertrophy is
evident. Generalized mild cervical disc space loss, with moderate
disc space loss and mild to moderate endplate spurring at C5-C6.
Mild endplate spurring elsewhere.

Negative visible upper chest.
IMPRESSION: 1. No acute osseous abnormality identified in the cervical spine.
2. Moderate C5-C6 disc and endplate degeneration. Mild generalized
disc space loss and endplate spurring elsewhere.

## 2023-05-05 ENCOUNTER — Encounter: Payer: Self-pay | Admitting: Family Medicine

## 2023-05-05 ENCOUNTER — Ambulatory Visit (INDEPENDENT_AMBULATORY_CARE_PROVIDER_SITE_OTHER): Payer: Self-pay | Admitting: Family Medicine

## 2023-05-05 VITALS — BP 132/78 | HR 93 | Resp 16 | Ht 62.0 in | Wt 273.8 lb

## 2023-05-05 DIAGNOSIS — Z6841 Body Mass Index (BMI) 40.0 and over, adult: Secondary | ICD-10-CM | POA: Diagnosis not present

## 2023-05-05 DIAGNOSIS — B3789 Other sites of candidiasis: Secondary | ICD-10-CM | POA: Diagnosis not present

## 2023-05-05 MED ORDER — NYSTATIN 100000 UNIT/GM EX CREA: 1.0000 | TOPICAL_CREAM | Freq: Two times a day (BID) | CUTANEOUS | 4 refills | Status: AC

## 2023-05-05 NOTE — Progress Notes (Signed)
 Date:  05/05/2023   Name:  Grace Harris   DOB:  21-May-1965   MRN:  [id number]   Chief Complaint: Obesity (Requesting referral for [facility] for weight loss surgery [place] location. )  Patient is a 58 year old female who presents for a referral tobariatric surgery exam. The patient reports the following problems: obesity. Health maintenance has been reviewed up to date.    Other This is a chronic (weight loss by surgery desired) problem. The current episode started more than 1 year ago. The problem has been unchanged. Associated symptoms include a rash. Pertinent negatives include no abdominal pain, anorexia, arthralgias, change in bowel habit, chest pain, chills, congestion, coughing, diaphoresis, fatigue, fever, headaches, joint swelling, myalgias, nausea, neck pain, numbness, sore throat, swollen glands, urinary symptoms, vertigo, visual change, vomiting or weakness.  Rash The current episode started 1 to 4 weeks ago. The problem has been gradually worsening since onset. The affected locations include the genitalia and chest. The rash is characterized by redness and itchiness. Pertinent negatives include no anorexia, congestion, cough, fatigue, fever, rhinorrhea, shortness of breath, sore throat or vomiting. Past treatments include nothing.    Lab Results  Component Value Date   NA 141 07/11/2021   K 4.3 07/11/2021   CO2 24 07/11/2021   GLUCOSE 109 (H) 07/11/2021   BUN 15 07/11/2021   CREATININE 0.66 07/11/2021   CALCIUM 9.5 07/11/2021   EGFR 105 07/11/2021   GFRNONAA 84 10/13/2019   Lab Results  Component Value Date   CHOL 214 (H) 09/09/2018   HDL 62 09/09/2018   LDLCALC 142 (H) 09/09/2018   TRIG 50 09/09/2018   CHOLHDL 3.5 09/09/2018   Lab Results  Component Value Date   TSH 1.020 07/11/2021   Lab Results  Component Value Date   HGBA1C 5.9 11/10/2019   Lab Results  Component Value Date   WBC 5.0 07/11/2021   HGB 13.6 07/11/2021   HCT 41.2  07/11/2021   MCV 88 07/11/2021   PLT 198 07/11/2021   Lab Results  Component Value Date   ALT 12 09/09/2018   AST 14 09/09/2018   ALKPHOS 45 09/09/2018   BILITOT 0.2 09/09/2018   No results found for: "25OHVITD2", "25OHVITD3", "VD25OH"   Review of Systems  Constitutional:  Negative for chills, diaphoresis, fatigue and fever.  HENT:  Negative for congestion, facial swelling, hearing loss, mouth sores, nosebleeds, postnasal drip, rhinorrhea, sinus pressure, sinus pain and sore throat.   Respiratory:  Negative for cough, chest tightness and shortness of breath.   Cardiovascular:  Negative for chest pain and palpitations.  Gastrointestinal:  Negative for abdominal pain, anorexia, change in bowel habit, constipation, nausea and vomiting.  Genitourinary:  Negative for difficulty urinating.  Musculoskeletal:  Negative for arthralgias, joint swelling, myalgias and neck pain.  Skin:  Positive for rash. Negative for color change, pallor and wound.  Neurological:  Negative for vertigo, weakness, numbness and headaches.    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 02/10/2022   Screening for colon cancer    Varicose veins of left lower extremity with inflammation 04/14/2018   Varicose veins of left lower extremity with inflammation 04/14/2018   Bilateral lower extremity edema 10/07/2017   History of total left knee replacement (TKR) 10/07/2017   Phlebitis of leg, left, superficial 08/29/2017   Morbid obesity with BMI of 40.0-44.9, adult (HCC) 11/20/2016   Unilateral primary osteoarthritis, right knee  07/15/2016   Primary osteoarthritis of left knee 07/15/2016   Cellulitis of left lower extremity 09/22/2010    No Known Allergies  Past Surgical History:  Procedure Laterality Date   CESAREAN SECTION     x 2   COLONOSCOPY WITH PROPOFOL N/A 05/09/2020   Procedure: COLONOSCOPY WITH PROPOFOL;  Surgeon: Pasty Spillers, MD;  Location: [facility];  Service: Endoscopy;  Laterality: N/A;   JOINT REPLACEMENT Left 04/2017   @ [facility] [place]   KNEE CLOSED REDUCTION Left 05/28/2017   Procedure: CLOSED MANIPULATION KNEE;  Surgeon: Erin Sons, MD;  Location: [facility] ORS;  Service: Orthopedics;  Laterality: Left;   VARICOSE VEIN SURGERY      Social History   Tobacco Use   Smoking status: Never   Smokeless tobacco: Never  Vaping Use   Vaping status: Never Used  Substance Use Topics   Alcohol use: Yes    Alcohol/week: 0.0 standard drinks of alcohol    Comment: RARE (2x/month)   Drug use: No     Medication list has been reviewed and updated.  Current Meds  Medication Sig   acetaminophen (TYLENOL) 500 MG tablet Take by mouth.   Clobetasol Prop Emollient Base 0.05 % emollient cream    meloxicam (MOBIC) 15 MG tablet Take 1 tablet (15 mg total) by mouth daily as needed for pain.       05/05/2023    2:44 PM 07/10/2021    4:14 PM 07/03/2020    1:55 PM 04/19/2020   11:56 AM  GAD 7 : Generalized Anxiety Score  Nervous, Anxious, on Edge 0 0 0 0  Control/stop worrying 0 0 0 0  Worry too much - different things 0 0 0 0  Trouble relaxing 0 1 0 0  Restless 0 0 0 0  Easily annoyed or irritable 0 1 0 0  Afraid - awful might happen 0 0 0 0  Total GAD 7 Score 0 2 0 0  Anxiety Difficulty Not difficult at all Not difficult at all         05/05/2023    2:44 PM 07/10/2021    4:14 PM 07/03/2020    1:55 PM  Depression screen PHQ 2/9  Decreased Interest 1 1 0  Down, Depressed, Hopeless 0 0 0  PHQ - 2 Score 1 1 0  Altered sleeping  3 0  Tired, decreased energy  2 0  Change in appetite  0 0  Feeling bad or failure about yourself   0 0  Trouble concentrating  2 0  Moving slowly or fidgety/restless  0 0  Suicidal thoughts  0 0  PHQ-9 Score  8 0  Difficult doing work/chores  Somewhat difficult     BP Readings from Last 3 Encounters:  05/05/23 132/78  02/08/22 100/78  02/06/22  100/64    Physical Exam Vitals and nursing note reviewed.  Constitutional:      General: She is not in acute distress.    Appearance: She is not diaphoretic.  HENT:     Head: Normocephalic and atraumatic.     Right Ear: External ear normal.     Left Ear: External ear normal.     Nose: Nose normal.  Eyes:     General:        Right eye: No discharge.        Left eye: No discharge.     Conjunctiva/sclera: Conjunctivae normal.     Pupils: Pupils are equal, round, and  reactive to light.  Neck:     Thyroid: No thyromegaly.     Vascular: No JVD.  Cardiovascular:     Rate and Rhythm: Normal rate and regular rhythm.     Heart sounds: Normal heart sounds. No murmur heard.    No friction rub. No gallop.  Pulmonary:     Effort: Pulmonary effort is normal.     Breath sounds: Normal breath sounds. No wheezing, rhonchi or rales.  Abdominal:     General: Bowel sounds are normal.     Palpations: Abdomen is soft. There is no mass.     Tenderness: There is no abdominal tenderness. There is no guarding.  Musculoskeletal:     Cervical back: Normal range of motion and neck supple.  Lymphadenopathy:     Cervical: No cervical adenopathy.  Skin:    General: Skin is warm and dry.  Neurological:     Mental Status: She is alert.     Sensory: Sensory deficit: bmi 50.     Wt Readings from Last 3 Encounters:  05/05/23 273 lb 12.8 oz (124.2 kg)  02/08/22 238 lb (108 kg)  02/06/22 239 lb (108.4 kg)    BP 132/78   Pulse 93   Resp 16   Ht 5\' 2"  (1.575 m)   Wt 273 lb 12.8 oz (124.2 kg)   SpO2 100%   BMI 50.08 kg/m   Assessment and Plan:  1. BMI 50.0-59.9, adult (HCC) (Primary) Chronic.  Persistent.  But recent weight gain over the course of the last 14 months due to a prolonged rehabilitation of knee surgery has contributed to a 40 pound weight gain.  Patient's BMI is over 50 now and realizes that something more proactive needs to be done at this time other than diet and exercise.  We will  refer to bariatric clinic at [facility] in [facility] for evaluation and possible treatment. - Amb Referral to Bariatric Surgery  2. Candidiasis of breast New onset.  Persistent last several weeks patient has had a rash that is pruritic and erythematous beneath the breasts and in the inguinal area.  This is consistent with candidiasis and we will treat with nystatin cream apply twice a day on as-needed basis and may add some A&E ointment to it as well in case there is a little staph infection that is secondarily involved. - nystatin cream (MYCOSTATIN); Apply 1 Application topically 2 (two) times daily.  Dispense: 30 g; Refill: 4    Elizabeth Sauer, MD

## 2023-05-10 ENCOUNTER — Ambulatory Visit
Admission: EM | Admit: 2023-05-10 | Discharge: 2023-05-10 | Disposition: A | Discharge status: Home / Self Care | Attending: Family Medicine | Admitting: Family Medicine

## 2023-05-10 DIAGNOSIS — J101 Influenza due to other identified influenza virus with other respiratory manifestations: Secondary | ICD-10-CM

## 2023-05-10 LAB — RESP PANEL BY RT-PCR (FLU A&B, COVID) ARPGX2
Influenza A by PCR: POSITIVE — AB
Influenza B by PCR: NEGATIVE
SARS Coronavirus 2 by RT PCR: NEGATIVE

## 2023-05-10 MED ORDER — OSELTAMIVIR PHOSPHATE 75 MG PO CAPS: 75.0000 mg | ORAL_CAPSULE | Freq: Two times a day (BID) | ORAL | 0 refills | Status: AC

## 2023-05-10 MED ORDER — PROMETHAZINE-DM 6.25-15 MG/5ML PO SYRP: 5.0000 mL | ORAL_SOLUTION | Freq: Four times a day (QID) | ORAL | 0 refills | Status: AC | PRN

## 2023-05-10 NOTE — ED Provider Notes (Signed)
 [facility]    CSN: [id number] Arrival date & time: 05/10/23  2956      History   Chief Complaint No chief complaint on file.   HPI Grace Harris is a 58 y.o. female.   HPI  History obtained from the patient. Grace Harris presents for nasal congestion, coughing up stuff, chills and headache that started 2 days ago. She didn't think she had a fever until the nurse told her.  No vomiting or diarrhea.  Tried AlkaSeltzer plus. She went to work today.  Took 3 home COVID tests that were negative.    No history of asthma. Denies vaping and smoking.     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 02/10/2022   Screening for colon cancer    Varicose veins of left lower extremity with inflammation 04/14/2018   Varicose veins of left lower extremity with inflammation 04/14/2018   Bilateral lower extremity edema 10/07/2017   History of total left knee replacement (TKR) 10/07/2017   Phlebitis of leg, left, superficial 08/29/2017   Morbid obesity with BMI of 40.0-44.9, adult (HCC) 11/20/2016   Unilateral primary osteoarthritis, right knee 07/15/2016   Primary osteoarthritis of left knee 07/15/2016   Cellulitis of left lower extremity 09/22/2010    Past Surgical History:  Procedure Laterality Date   CESAREAN SECTION     x 2   COLONOSCOPY WITH PROPOFOL N/A 05/09/2020   Procedure: COLONOSCOPY WITH PROPOFOL;  Surgeon: Pasty Spillers, MD;  Location: [facility];  Service: Endoscopy;  Laterality: N/A;   JOINT REPLACEMENT Left 04/2017   @ [facility] [place]   KNEE CLOSED REDUCTION Left 05/28/2017   Procedure: CLOSED MANIPULATION KNEE;  Surgeon: Erin Sons, MD;  Location: [facility];  Service: Orthopedics;  Laterality: Left;   VARICOSE VEIN SURGERY      OB History   No obstetric history on file.       Home Medications    Prior to Admission medications   Medication Sig Start Date End Date Taking? Authorizing Provider  oseltamivir (TAMIFLU) 75 MG capsule Take 1 capsule (75 mg total) by mouth every 12 (twelve) hours. 05/10/23  Yes Taydem Cavagnaro, DO  promethazine-dextromethorphan (PROMETHAZINE-DM) 6.25-15 MG/5ML syrup Take 5 mLs by mouth 4 (four) times daily as needed. 05/10/23  Yes Jesiel Garate, Seward Meth, DO  acetaminophen (TYLENOL) 500 MG tablet Take by mouth.    [provider]  ALPRAZolam (XANAX) 0.5 MG tablet TAKE 1 TABLET BY MOUTH 1 HOUR PRIOR TO PROCEDURE. TAKE SECOND TABLET UPON ARRIVAL. Patient not taking: Reported on 05/05/2023 04/13/18   [provider]  azithromycin (ZITHROMAX) 250 MG tablet Take 2 tablets (500 mg) on  Day 1,  followed by 1 tablet (250 mg) once daily on Days 2 through 5. Patient not taking: Reported on 05/05/2023 03/25/23   [provider]  Clobetasol Prop Emollient Base 0.05 % emollient cream  09/29/18   [provider]  meloxicam (MOBIC) 15 MG tablet Take 1 tablet (15 mg total) by mouth daily as needed for pain. 02/08/22   Jerrol Banana, MD  nystatin cream (MYCOSTATIN) Apply 1 Application topically 2 (two) times daily. 05/05/23   Duanne Limerick, MD  traMADol (ULTRAM) 50 MG tablet Take by mouth. Patient not taking: Reported on 05/05/2023 12/30/18   [provider]  Family History Family History  Problem Relation Age of Onset   Diabetes Maternal Uncle    Cancer Maternal Grandmother    Alcohol abuse Mother    Breast cancer Neg Hx     Social History Social History   Tobacco Use   Smoking status: Never   Smokeless tobacco: Never  Vaping Use   Vaping status: Never Used  Substance Use Topics   Alcohol use: Yes    Alcohol/week: 0.0 standard drinks of alcohol    Comment: RARE (2x/month)   Drug use: No     Allergies   Patient has no known allergies.   Review of Systems Review of Systems: negative unless  otherwise stated in HPI.      Physical Exam Triage Vital Signs ED Triage Vitals  Encounter Vitals Group     BP 05/10/23 0940 131/84     Systolic BP Percentile --      Diastolic BP Percentile --      Pulse Rate 05/10/23 0940 94     Resp 05/10/23 0940 16     Temp 05/10/23 0940 (!) 101 F (38.3 C)     Temp Source 05/10/23 0940 Oral     SpO2 05/10/23 0940 95 %     Weight 05/10/23 0938 273 lb (123.8 kg)     Height 05/10/23 0938 5\' 2"  (1.575 m)     Head Circumference --      Peak Flow --      Pain Score 05/10/23 0938 4     Pain Loc --      Pain Education --      Exclude from Growth Chart --    No data found.  Updated Vital Signs BP 131/84 (BP Location: Left Arm)   Pulse 94   Temp (!) 101 F (38.3 C) (Oral)   Resp 16   Ht 5\' 2"  (1.575 m)   Wt 123.8 kg   LMP 05/01/2020 (Exact Date)   SpO2 95%   BMI 49.93 kg/m   Visual Acuity Right Eye Distance:   Left Eye Distance:   Bilateral Distance:    Right Eye Near:   Left Eye Near:    Bilateral Near:     Physical Exam GEN:     alert, ill but non-toxic appearing female in no distress    HENT:  mucus membranes moist, oropharyngeal without lesions or erythema, no tonsillar hypertrophy or exudates,  moderate erythematous edematous turbinates, clear nasal discharge EYES:   pupils equal and reactive, no scleral injection or discharge NECK:  normal ROM, no meningismus   RESP:  no increased work of breathing, clear to auscultation bilaterally CVS:   regular rate and rhythm Skin:   warm and dry    UC Treatments / Results  Labs (all labs ordered are listed, but only abnormal results are displayed) Labs Reviewed  RESP PANEL BY RT-PCR (FLU A&B, COVID) ARPGX2 - Abnormal; Notable for the following components:      Result Value   Influenza A by PCR POSITIVE (*)    All other components within normal limits    EKG   Radiology No results found.  Procedures Procedures (including critical care time)  Medications Ordered in  UC Medications - No data to display  Initial Impression / Assessment and Plan / UC Course  I have reviewed the triage vital signs and the nursing notes.  Pertinent labs & imaging results that were available during my care of the patient were reviewed by me and considered in  my medical decision making (see chart for details).       Pt is a 58 y.o. female who presents for 2 days of respiratory symptoms. Grace Harris is febrile here at 9 F. Satting well on room air. Overall pt is ill but non-toxic appearing, well hydrated, without respiratory distress. Pulmonary exam is unremarkable.  COVID and influenza panel obtained and was positive. Discussed symptomatic treatment.  After shared decision making she is interested in Tamiflu.  Promethazine DM also prescribed for cough.  Explained lack of efficacy of antibiotics in viral disease.  Typical duration of symptoms discussed.   Return and ED precautions given and voiced understanding. Discussed MDM, treatment plan and plan for follow-up with patient who agrees with plan.     Final Clinical Impressions(s) / UC Diagnoses   Final diagnoses:  Influenza A with respiratory manifestations     Discharge Instructions      You have influenza A.  Tamiflu was prescribed.  Your symptoms will gradually improve over the next 7 to 10 days.  The cough may last about 3 weeks.   Take ibuprofen 600 mg with Tylenol 1000 mg for fever, headache or body aches.   For cough: You can also use guaifenesin and dextromethorphan for cough. You can use a humidifier for chest congestion and cough.  If you don't have a humidifier, you can sit in the bathroom with the hot shower running.      For sore throat: try warm salt water gargles, Mucinex sore throat cough drops or cepacol lozenges, throat spray, warm tea or water with lemon/honey, popsicles or ice, or OTC cold relief medicine for throat discomfort. You can also purchase chloraseptic spray at the pharmacy or dollar  store.   For congestion: take a daily anti-histamine like Zyrtec, Claritin, and a oral decongestant, such as pseudoephedrine.  You can also use Flonase 1-2 sprays in each nostril daily. Afrin is also a good option, if you do not have high blood pressure.    It is important to stay hydrated: drink plenty of fluids (water, gatorade/powerade/pedialyte, juices, or teas) to keep your throat moisturized and help further relieve irritation/discomfort.    Return or go to the Emergency Department if symptoms worsen or do not improve in the next few days      ED Prescriptions     Medication Sig Dispense Auth. Provider   oseltamivir (TAMIFLU) 75 MG capsule Take 1 capsule (75 mg total) by mouth every 12 (twelve) hours. 10 capsule Rosan Calbert, DO   promethazine-dextromethorphan (PROMETHAZINE-DM) 6.25-15 MG/5ML syrup Take 5 mLs by mouth 4 (four) times daily as needed. 118 mL Katha Cabal, DO      PDMP not reviewed this encounter.   Katha Cabal, DO 05/11/23 1511

## 2023-05-10 NOTE — Discharge Instructions (Signed)
 You have influenza A.  Tamiflu wasprescribed.  Your symptoms will gradually improve over the next 7 to 10 days.  The cough may last about 3 weeks.   Take ibuprofen 600 mg with Tylenol 1000 mg for fever, headache or body aches.   For cough: You can also use guaifenesin and dextromethorphan for cough. You can use a humidifier for chest congestion and cough.  If you don't have a humidifier, you can sit in the bathroom with the hot shower running.      For sore throat: try warm salt water gargles, Mucinex sore throat cough drops or cepacol lozenges, throat spray, warm tea or water with lemon/honey, popsicles or ice, or OTC cold relief medicine for throat discomfort. You can also purchase chloraseptic spray at the pharmacy or dollar store.   For congestion: take a daily anti-histamine like Zyrtec, Claritin, and a oral decongestant, such as pseudoephedrine.  You can also use Flonase 1-2 sprays in each nostril daily. Afrin is also a good option, if you do not have high blood pressure.    It is important to stay hydrated: drink plenty of fluids (water, gatorade/powerade/pedialyte, juices, or teas) to keep your throat moisturized and help further relieve irritation/discomfort.    Return or go to the Emergency Department if symptoms worsen or do not improve in the next few days

## 2023-05-10 NOTE — ED Triage Notes (Addendum)
 Patient presents with congestion, chills, sinus drainage (green and yellow) x day 3. Treated with Alka seltzer plus and mucinex with minimal relief. Pt states she took 3 covid test, negative.

## 2023-05-12 ENCOUNTER — Other Ambulatory Visit: Payer: Self-pay | Admitting: Family Medicine

## 2023-05-12 ENCOUNTER — Encounter: Payer: Self-pay | Admitting: Family Medicine

## 2023-05-13 ENCOUNTER — Other Ambulatory Visit: Payer: Self-pay

## 2023-05-13 DIAGNOSIS — L299 Pruritus, unspecified: Secondary | ICD-10-CM

## 2023-05-13 MED ORDER — CLOBETASOL PROP EMOLLIENT BASE 0.05 % EX CREA: TOPICAL_CREAM | CUTANEOUS | 0 refills | Status: AC

## 2023-05-13 NOTE — Telephone Encounter (Signed)
 Please review.  KP

## 2023-06-05 ENCOUNTER — Ambulatory Visit: Attending: Otolaryngology

## 2023-06-05 DIAGNOSIS — R400 Somnolence: Secondary | ICD-10-CM | POA: Diagnosis not present

## 2023-06-05 DIAGNOSIS — G4733 Obstructive sleep apnea (adult) (pediatric): Secondary | ICD-10-CM | POA: Diagnosis present

## 2023-06-05 DIAGNOSIS — E66813 Obesity, class 3: Secondary | ICD-10-CM | POA: Insufficient documentation

## 2023-06-05 DIAGNOSIS — Z6841 Body Mass Index (BMI) 40.0 and over, adult: Secondary | ICD-10-CM | POA: Diagnosis not present

## 2023-07-22 ENCOUNTER — Encounter (INDEPENDENT_AMBULATORY_CARE_PROVIDER_SITE_OTHER): Payer: Self-pay
